# Patient Record
Sex: Female | Born: 1990 | Race: Black or African American | Hispanic: No | Marital: Single | Smoking: Current some day smoker
Health system: Southern US, Community
[De-identification: ages and names within clinical notes are randomized; demographics above are authoritative.]

## PROBLEM LIST (undated history)

## (undated) DIAGNOSIS — N871 Moderate cervical dysplasia: Secondary | ICD-10-CM

## (undated) DIAGNOSIS — A599 Trichomoniasis, unspecified: Secondary | ICD-10-CM

## (undated) DIAGNOSIS — B9689 Other specified bacterial agents as the cause of diseases classified elsewhere: Secondary | ICD-10-CM

## (undated) DIAGNOSIS — A749 Chlamydial infection, unspecified: Secondary | ICD-10-CM

## (undated) DIAGNOSIS — N760 Acute vaginitis: Secondary | ICD-10-CM

## (undated) DIAGNOSIS — A549 Gonococcal infection, unspecified: Secondary | ICD-10-CM

## (undated) HISTORY — DX: Moderate cervical dysplasia: N87.1

## (undated) HISTORY — PX: WISDOM TOOTH EXTRACTION: SHX21

---
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
## 2012-11-24 ENCOUNTER — Emergency Department (INDEPENDENT_AMBULATORY_CARE_PROVIDER_SITE_OTHER)
Admission: EM | Admit: 2012-11-24 | Discharge: 2012-11-24 | Disposition: A | Discharge status: Home / Self Care | Payer: Medicaid Other | Source: Home / Self Care

## 2012-11-24 ENCOUNTER — Encounter (HOSPITAL_COMMUNITY): Payer: Self-pay | Admitting: Emergency Medicine

## 2012-11-24 DIAGNOSIS — J029 Acute pharyngitis, unspecified: Secondary | ICD-10-CM

## 2012-11-24 DIAGNOSIS — R590 Localized enlarged lymph nodes: Secondary | ICD-10-CM

## 2012-11-24 MED ORDER — AMOXICILLIN 500 MG PO CAPS: 500.0000 mg | ORAL_CAPSULE | Freq: Three times a day (TID) | ORAL | Status: DC

## 2012-11-24 NOTE — ED Provider Notes (Signed)
History     CSN: [id number]  Arrival date & time 11/24/12  1847   None     Chief Complaint  Patient presents with  . URI    (Consider location/radiation/quality/duration/timing/severity/associated sxs/prior treatment) HPI Comments: 22 year old female that awoke this morning with sore throat, bodyaches, headache, malaise and decreased appetite.    History reviewed. No pertinent past medical history.  History reviewed. No pertinent past surgical history.  No family history on file.  History  Substance Use Topics  . Smoking status: Never Smoker   . Smokeless tobacco: Not on file  . Alcohol Use: No    OB History   Grav Para Term Preterm Abortions TAB SAB Ect Mult Living                  Review of Systems  Constitutional: Negative for fever, chills, activity change, appetite change and fatigue.  HENT: Positive for sore throat. Negative for congestion, facial swelling, rhinorrhea, neck pain, neck stiffness and postnasal drip.   Eyes: Negative.   Respiratory: Negative.   Cardiovascular: Negative.   Gastrointestinal: Negative.   Musculoskeletal: Positive for myalgias.  Skin: Negative.  Negative for pallor and rash.  Neurological: Negative.   Psychiatric/Behavioral: Negative.     Allergies  Review of patient's allergies indicates no known allergies.  Home Medications   Current Outpatient Rx  Name  Route  Sig  Dispense  Refill  . amoxicillin (AMOXIL) 500 MG capsule   Oral   Take 1 capsule (500 mg total) by mouth 3 (three) times daily.   21 capsule   0     BP 116/64  Pulse 129  Temp(Src) 99.7 F (37.6 C) (Oral)  Resp 18  SpO2 100%  LMP 11/10/2012  Physical Exam  Nursing note and vitals reviewed. Constitutional: She is oriented to person, place, and time. She appears well-developed and well-nourished. No distress.  HENT:  Right Ear: External ear normal.  Left Ear: External ear normal.  Oro pharynx with mildly enlarged  cryptic tonsils with white and  gray exudates. Posterior pharynx with erythema.   Eyes: Conjunctivae are normal.  Neck: Normal range of motion. Neck supple.  Cardiovascular: Normal rate, regular rhythm and normal heart sounds.   Pulmonary/Chest: Effort normal and breath sounds normal. No respiratory distress.  Musculoskeletal: Normal range of motion. She exhibits no edema.  Lymphadenopathy:    She has cervical adenopathy.  Neurological: She is alert and oriented to person, place, and time.  Skin: Skin is warm and dry. No rash noted.  Psychiatric: She has a normal mood and affect.    ED Course  Procedures (including critical care time)  Labs Reviewed  POCT RAPID STREP A ([facility] ONLY)   No results found. will will call  Results for orders placed during the hospital encounter of 11/24/12  POCT RAPID STREP A ([facility] ONLY)      Result Value Range   Streptococcus, Group A Screen (Direct) NEGATIVE  NEGATIVE      1. Exudative pharyngitis   2. Anterior cervical lymphadenopathy       MDM  Amoxil 500 tid x 7 d Cepacol Loz Ibuprofen 600 q 8h prn Rest at home        Hayden Rasmussen, NP 11/24/12 1942

## 2012-11-24 NOTE — ED Notes (Signed)
Pt c/o cold sx since this am Sx include: sore throat, chills, headache, body aches, felt warm, dry cough Denies: v/n/d  She is alert and oriented w/no signs of acute distress.

## 2012-11-29 NOTE — ED Provider Notes (Signed)
Medical screening examination/treatment/procedure(s) were performed by resident physician or non-physician practitioner and as supervising physician I was immediately available for consultation/collaboration.   Carron Mcmurry DOUGLAS MD.   Cartha Rotert D Favour Aleshire, MD 11/29/12 1004 

## 2013-07-06 ENCOUNTER — Encounter (HOSPITAL_COMMUNITY): Payer: Self-pay | Admitting: Emergency Medicine

## 2013-07-06 ENCOUNTER — Other Ambulatory Visit (HOSPITAL_COMMUNITY)
Admission: RE | Admit: 2013-07-06 | Discharge: 2013-07-06 | Disposition: A | Discharge status: Home / Self Care | Payer: Medicaid Other | Source: Ambulatory Visit | Attending: Family Medicine | Admitting: Family Medicine

## 2013-07-06 ENCOUNTER — Emergency Department (INDEPENDENT_AMBULATORY_CARE_PROVIDER_SITE_OTHER)
Admission: EM | Admit: 2013-07-06 | Discharge: 2013-07-06 | Disposition: A | Discharge status: Home / Self Care | Payer: Medicaid Other | Source: Home / Self Care

## 2013-07-06 DIAGNOSIS — N760 Acute vaginitis: Secondary | ICD-10-CM | POA: Insufficient documentation

## 2013-07-06 DIAGNOSIS — Z113 Encounter for screening for infections with a predominantly sexual mode of transmission: Secondary | ICD-10-CM | POA: Insufficient documentation

## 2013-07-06 DIAGNOSIS — R1032 Left lower quadrant pain: Secondary | ICD-10-CM

## 2013-07-06 LAB — POCT URINALYSIS DIP (DEVICE)
Glucose, UA: NEGATIVE mg/dL
Nitrite: NEGATIVE
Protein, ur: NEGATIVE mg/dL
Specific Gravity, Urine: 1.025 (ref 1.005–1.030)
Urobilinogen, UA: 2 mg/dL — ABNORMAL HIGH (ref 0.0–1.0)
pH: 7 (ref 5.0–8.0)

## 2013-07-06 LAB — POCT PREGNANCY, URINE: Preg Test, Ur: NEGATIVE

## 2013-07-06 NOTE — ED Notes (Signed)
Pt is here for intermittent abd pain onset may after she had her nexplanon placed Denies: f/v/n/d, urinary sxs Takes ibup w/temp relief She is alert w/no signs of acute distress.

## 2013-07-06 NOTE — ED Provider Notes (Signed)
Kristin Kline is a 22 y.o. female who presents to Urgent Care today for abdominal cramping. Patient does note intermittent mild to moderate abdominal pain and cramping since her Nexplanon was inserted in May of 2014. She notes irregular bleeding as well. She denies any severe or current abdominal pain nausea vomiting diarrhea or vaginal discharge or dysuria. She notes her symptoms worsened over the past last week. Her last menstrual period was in October however she started bleeding today. She feels well otherwise   History reviewed. No pertinent past medical history. History  Substance Use Topics  . Smoking status: Never Smoker   . Smokeless tobacco: Not on file  . Alcohol Use: No   ROS as above Medications reviewed. No current facility-administered medications for this encounter.   No current outpatient prescriptions on file.    Exam:  BP 131/85  Pulse 68  Temp(Src) 98.7 F (37.1 C) (Oral)  Resp 18  SpO2 100%  LMP 06/11/2013 Gen: Well NAD HEENT: EOMI,  MMM Lungs: CTABL Nl WOB Heart: RRR no MRG Abd: NABS, NT, ND, no CV angle tenderness to percussion Exts: Non edematous BL  LE, warm and well perfused.  GYN: Normal external genitalia. Vaginal canal is normal appearing. Cervix with some blood. Otherwise normal Bimanual exam is unremarkable and nontender  Results for orders placed during the hospital encounter of 07/06/13 (from the past 24 hour(s))  POCT URINALYSIS DIP (DEVICE)     Status: Abnormal   Collection Time    07/06/13  4:17 PM      Result Value Range   Glucose, UA NEGATIVE  NEGATIVE mg/dL   Bilirubin Urine NEGATIVE  NEGATIVE   Ketones, ur NEGATIVE  NEGATIVE mg/dL   Specific Gravity, Urine 1.025  1.005 - 1.030   Hgb urine dipstick MODERATE (*) NEGATIVE   pH 7.0  5.0 - 8.0   Protein, ur NEGATIVE  NEGATIVE mg/dL   Urobilinogen, UA 2.0 (*) 0.0 - 1.0 mg/dL   Nitrite NEGATIVE  NEGATIVE   Leukocytes, UA NEGATIVE  NEGATIVE  POCT PREGNANCY, URINE     Status: None    Collection Time    07/06/13  4:22 PM      Result Value Range   Preg Test, Ur NEGATIVE  NEGATIVE   No results found.  Assessment and Plan: 22 y.o. female with abdominal cramping. Possible menstrual cramping. Unclear etiology at this point. Appears to be more benign in nature. Cytology for gonorrhea, Chlamydia, trichomonas, yeast and BV are pending. Refer to OB/GYN for further evaluation and management Discussed warning signs or symptoms. Please see discharge instructions. Patient expresses understanding.      Rodolph Bong, MD 07/06/13 [phone]

## 2013-07-10 ENCOUNTER — Telehealth (HOSPITAL_COMMUNITY): Payer: Self-pay | Admitting: *Deleted

## 2013-07-10 ENCOUNTER — Telehealth (HOSPITAL_COMMUNITY): Payer: Self-pay | Admitting: Family Medicine

## 2013-07-10 MED ORDER — METRONIDAZOLE 500 MG PO TABS: 500.0000 mg | ORAL_TABLET | Freq: Two times a day (BID) | ORAL | Status: DC

## 2013-07-10 NOTE — ED Notes (Signed)
Cytology showing BV. Flagyl called into [facility]. RN to call patient  Rodolph Bong, MD 07/10/13 [phone]

## 2013-07-10 NOTE — ED Notes (Signed)
GC/Chlamydia neg., Affirm: Candida and Trich neg., Gardnerella pos.  Dr. Denyse Amass reviewed labs and e-prescribed Flagyl to the [facility] on [address].  I called pt.  Pt. verified x 2 and given results.  Pt. told she needs Flagyl for bacterial vaginosis.  Pt. told where to pick up Rx.  Pt. instructed to no alcohol while taking this medication. Pt. voiced understanding. Vassie Moselle 07/10/2013

## 2014-06-01 ENCOUNTER — Encounter (HOSPITAL_COMMUNITY): Payer: Self-pay | Admitting: Emergency Medicine

## 2014-06-01 ENCOUNTER — Emergency Department (INDEPENDENT_AMBULATORY_CARE_PROVIDER_SITE_OTHER)
Admission: EM | Admit: 2014-06-01 | Discharge: 2014-06-01 | Disposition: A | Discharge status: Home / Self Care | Payer: Self-pay | Source: Home / Self Care

## 2014-06-01 DIAGNOSIS — N926 Irregular menstruation, unspecified: Secondary | ICD-10-CM

## 2014-06-01 DIAGNOSIS — N939 Abnormal uterine and vaginal bleeding, unspecified: Secondary | ICD-10-CM

## 2014-06-01 LAB — POCT I-STAT, CHEM 8
BUN: 7 mg/dL (ref 6–23)
Calcium, Ion: 1.28 mmol/L — ABNORMAL HIGH (ref 1.12–1.23)
Chloride: 106 mEq/L (ref 96–112)
Creatinine, Ser: 1 mg/dL (ref 0.50–1.10)
Glucose, Bld: 93 mg/dL (ref 70–99)
HEMATOCRIT: 44 % (ref 36.0–46.0)
HEMOGLOBIN: 15 g/dL (ref 12.0–15.0)
POTASSIUM: 4.5 meq/L (ref 3.7–5.3)
SODIUM: 140 meq/L (ref 137–147)
TCO2: 24 mmol/L (ref 0–100)

## 2014-06-01 LAB — POCT PREGNANCY, URINE: PREG TEST UR: NEGATIVE

## 2014-06-01 MED ORDER — NORETHINDRONE-ETH ESTRADIOL 1-35 MG-MCG PO TABS: ORAL_TABLET | ORAL | Status: DC

## 2014-06-01 NOTE — ED Notes (Signed)
Reportedly has been having vaginal bleeding off and on since August. Has an implanted in arm , and is also on depo for [facility] until she goes back in to have her implanon removed in a few weeks. States she was tested for STD's and had a female exam on her [facility] visit. NAD

## 2014-06-01 NOTE — ED Provider Notes (Signed)
CSN: [id number]     Arrival date & time 06/01/14  1507 History   First MD Initiated Contact with Patient 06/01/14 1541     Chief Complaint  Patient presents with  . Vaginal Bleeding   (Consider location/radiation/quality/duration/timing/severity/associated sxs/prior Treatment) HPI Comments: 23 year old female complaining of any with vaginal bleeding for one month. She is currently using the Implanon device for birth control. She states she also received a Depakote injection in July. The bleeding is on a daily basis and near constant. She uses 3-4 tampons per day. Occasionally has mild pelvic cramping.   History reviewed. No pertinent past medical history. History reviewed. No pertinent past surgical history. History reviewed. No pertinent family history. History  Substance Use Topics  . Smoking status: Never Smoker   . Smokeless tobacco: Not on file  . Alcohol Use: No   OB History   Grav Para Term Preterm Abortions TAB SAB Ect Mult Living                 Review of Systems  Constitutional: Negative for fever, activity change and fatigue.  HENT: Negative.   Respiratory: Negative for cough and shortness of breath.   Cardiovascular: Negative for chest pain.  Gastrointestinal: Negative.   Genitourinary: Positive for vaginal bleeding and menstrual problem. Negative for dysuria, urgency, frequency, flank pain, vaginal discharge and vaginal pain.  Skin: Negative for pallor and rash.    Allergies  Review of patient's allergies indicates no known allergies.  Home Medications   Prior to Admission medications   Medication Sig Start Date End Date Taking? Authorizing Provider  norethindrone-ethinyl estradiol 1/35 (ORTHO-NOVUM 1/35, 28,) tablet One tablet bid x 3 d, then 1 q d. 06/01/14   Hayden Rasmussen, NP   BP 104/67  Pulse 66  Temp(Src) 97.9 F (36.6 C) (Oral)  Resp 12  SpO2 100% Physical Exam  Nursing note and vitals reviewed. Constitutional: She is oriented to person, place, and  time. She appears well-developed and well-nourished.  Pulmonary/Chest: Effort normal. No respiratory distress.  Abdominal: She exhibits no distension and no mass. There is no tenderness. There is no rebound and no guarding.  Genitourinary: Vagina normal. No vaginal discharge found.  Cx posterior Ectocx with nabothian cysts Os open and bleeding originating from os. No lesions seen.  Musculoskeletal: She exhibits no edema.  Neurological: She is alert and oriented to person, place, and time.  Skin: Skin is warm and dry.  Psychiatric: She has a normal mood and affect.    ED Course  Procedures (including critical care time) Labs Review Labs Reviewed  POCT I-STAT, CHEM 8 - Abnormal; Notable for the following:    Calcium, Ion 1.28 (*)    All other components within normal limits  POCT PREGNANCY, URINE   Results for orders placed during the hospital encounter of 06/01/14  POCT PREGNANCY, URINE      Result Value Ref Range   Preg Test, Ur NEGATIVE  NEGATIVE  POCT I-STAT, CHEM 8      Result Value Ref Range   Sodium 140  137 - 147 mEq/L   Potassium 4.5  3.7 - 5.3 mEq/L   Chloride 106  96 - 112 mEq/L   BUN 7  6 - 23 mg/dL   Creatinine, Ser 7.82  0.50 - 1.10 mg/dL   Glucose, Bld 93  70 - 99 mg/dL   Calcium, Ion 9.56 (*) 1.12 - 1.23 mmol/L   TCO2 24  0 - 100 mmol/L   Hemoglobin 15.0  12.0 -  15.0 g/dL   HCT 08.6  57.8 - 46.9 %    Imaging Review No results found.   MDM   1. Abnormal uterine bleeding unrelated to menstrual cycle     AUB secondary to progesterone excess, Implanon and Depo injection Tx with Ortho Novum 1/35 bid x 3 d then 1 q d. See PCP/GYN ASAP    Hayden Rasmussen, NP 06/01/14 1655

## 2014-06-01 NOTE — Discharge Instructions (Signed)
Abnormal Uterine Bleeding Abnormal uterine bleeding can affect women at various stages in life, including teenagers, women in their reproductive years, pregnant women, and women who have reached menopause. Several kinds of uterine bleeding are considered abnormal, including:  Bleeding or spotting between periods.   Bleeding after sexual intercourse.   Bleeding that is heavier or more than normal.   Periods that last longer than usual.  Bleeding after menopause.  Many cases of abnormal uterine bleeding are minor and simple to treat, while others are more serious. Any type of abnormal bleeding should be evaluated by your health care provider. Treatment will depend on the cause of the bleeding. HOME CARE INSTRUCTIONS Monitor your condition for any changes. The following actions may help to alleviate any discomfort you are experiencing:  Avoid the use of tampons and douches as directed by your health care provider.  Change your pads frequently. You should get regular pelvic exams and Pap tests. Keep all follow-up appointments for diagnostic tests as directed by your health care provider.  SEEK MEDICAL CARE IF:   Your bleeding lasts more than 1 week.   You feel dizzy at times.  SEEK IMMEDIATE MEDICAL CARE IF:   You pass out.   You are changing pads every 15 to 30 minutes.   You have abdominal pain.  You have a fever.   You become sweaty or weak.   You are passing large blood clots from the vagina.   You start to feel nauseous and vomit. MAKE SURE YOU:   Understand these instructions.  Will watch your condition.  Will get help right away if you are not doing well or get worse. Document Released: 08/24/2005 Document Revised: 08/29/2013 Document Reviewed: 03/23/2013 [employer] Patient Information 2015 [employer]. This information is not intended to replace advice given to you by your health care provider. Make sure you discuss any questions you have with your  health care provider.  

## 2014-06-02 NOTE — ED Provider Notes (Signed)
Medical screening examination/treatment/procedure(s) were performed by resident physician or non-physician practitioner and as supervising physician I was immediately available for consultation/collaboration.   Cullen Lahaie DOUGLAS MD.   Izaiyah Kleinman D Bryndle Corredor, MD 06/02/14 0952 

## 2014-09-11 ENCOUNTER — Emergency Department (HOSPITAL_COMMUNITY)
Admission: EM | Admit: 2014-09-11 | Discharge: 2014-09-11 | Disposition: A | Discharge status: Home / Self Care | Payer: Medicaid Other | Source: Home / Self Care | Attending: Family Medicine | Admitting: Family Medicine

## 2014-09-11 ENCOUNTER — Encounter (HOSPITAL_COMMUNITY): Payer: Self-pay | Admitting: *Deleted

## 2014-09-11 DIAGNOSIS — N938 Other specified abnormal uterine and vaginal bleeding: Secondary | ICD-10-CM

## 2014-09-11 LAB — POCT PREGNANCY, URINE: PREG TEST UR: NEGATIVE

## 2014-09-11 MED ORDER — NORETHINDRONE-ETH ESTRADIOL 1-35 MG-MCG PO TABS: ORAL_TABLET | ORAL | Status: DC

## 2014-09-11 NOTE — ED Provider Notes (Signed)
CSN: [id number]     Arrival date & time 09/11/14  1156 History   First MD Initiated Contact with Patient 09/11/14 1213     Chief Complaint  Patient presents with  . Vaginal Bleeding   (Consider location/radiation/quality/duration/timing/severity/associated sxs/prior Treatment) Patient is a 24 y.o. female presenting with vaginal bleeding. The history is provided by the patient.  Vaginal Bleeding Quality:  Clots and spotting Severity:  Mild Onset quality:  Gradual Duration:  4 months Progression:  Waxing and waning Chronicity:  Recurrent Menstrual history:  Irregular Number of tampons used:  4 Context comment:  Seen 06/01/14, problem continues, never had gyn f/u. Risk factors comment:  Has never been reg with birth control.   History reviewed. No pertinent past medical history. History reviewed. No pertinent past surgical history. History reviewed. No pertinent family history. History  Substance Use Topics  . Smoking status: Never Smoker   . Smokeless tobacco: Not on file  . Alcohol Use: No   OB History    No data available     Review of Systems  Constitutional: Negative.   Gastrointestinal: Negative.   Genitourinary: Positive for vaginal bleeding and menstrual problem.    Allergies  Review of patient's allergies indicates no known allergies.  Home Medications   Prior to Admission medications   Medication Sig Start Date End Date Taking? Authorizing Provider  norethindrone-ethinyl estradiol 1/35 (ORTHO-NOVUM, NORTREL,CYCLAFEM) tablet 1 pill bid for 1 week then 1 daily for 1 wk then start new pk at 1 tab daily. 09/11/14   Linna HoffJames D Emoni Whitworth, MD   BP 113/77 mmHg  Pulse 73  Temp(Src) 98.4 F (36.9 C) (Oral)  Resp 12  SpO2 100%  LMP  Physical Exam  Constitutional: She is oriented to person, place, and time. She appears well-developed and well-nourished.  Abdominal: Soft. Bowel sounds are normal.  Genitourinary: No vaginal discharge found.  Neurological: She is alert and  oriented to person, place, and time.  Skin: Skin is warm and dry.  Nursing note and vitals reviewed.   ED Course  Procedures (including critical care time) Labs Review Labs Reviewed  POCT PREGNANCY, URINE    Imaging Review No results found.   MDM   1. DUB (dysfunctional uterine bleeding)        Linna HoffJames D Aleya Durnell, MD 09/11/14 1244

## 2014-09-11 NOTE — ED Notes (Addendum)
Pt reports  Symptoms  Of  Vaginal bleeding   And low  abd  Pain    For  Over  1  Month        Pt  Takes    Depo  injection         Was  On implant  And  Had  It  Removed

## 2014-09-11 NOTE — Discharge Instructions (Signed)
Take pills as prescribed and call clinic for follow-up in 1 week.

## 2014-11-18 ENCOUNTER — Encounter (HOSPITAL_COMMUNITY): Payer: Self-pay

## 2014-11-18 ENCOUNTER — Emergency Department (HOSPITAL_COMMUNITY)
Admission: EM | Admit: 2014-11-18 | Discharge: 2014-11-18 | Disposition: A | Discharge status: Home / Self Care | Payer: Medicaid Other | Attending: Emergency Medicine | Admitting: Emergency Medicine

## 2014-11-18 DIAGNOSIS — H9209 Otalgia, unspecified ear: Secondary | ICD-10-CM | POA: Insufficient documentation

## 2014-11-18 DIAGNOSIS — Z793 Long term (current) use of hormonal contraceptives: Secondary | ICD-10-CM | POA: Insufficient documentation

## 2014-11-18 DIAGNOSIS — R51 Headache: Secondary | ICD-10-CM | POA: Diagnosis not present

## 2014-11-18 DIAGNOSIS — R630 Anorexia: Secondary | ICD-10-CM | POA: Diagnosis not present

## 2014-11-18 DIAGNOSIS — J029 Acute pharyngitis, unspecified: Secondary | ICD-10-CM | POA: Diagnosis present

## 2014-11-18 DIAGNOSIS — J36 Peritonsillar abscess: Secondary | ICD-10-CM | POA: Insufficient documentation

## 2014-11-18 DIAGNOSIS — Z79899 Other long term (current) drug therapy: Secondary | ICD-10-CM | POA: Diagnosis not present

## 2014-11-18 LAB — RAPID STREP SCREEN (MED CTR MEBANE ONLY): STREPTOCOCCUS, GROUP A SCREEN (DIRECT): NEGATIVE

## 2014-11-18 MED ORDER — DEXAMETHASONE SODIUM PHOSPHATE 10 MG/ML IJ SOLN
10.0000 mg | Freq: Once | INTRAMUSCULAR | Status: AC
  Administered 2014-11-18: 10 mg via INTRAMUSCULAR
  Filled 2014-11-18: qty 1

## 2014-11-18 MED ORDER — HYDROCODONE-ACETAMINOPHEN 7.5-325 MG/15ML PO SOLN: 15.0000 mL | Freq: Three times a day (TID) | ORAL | Status: DC | PRN

## 2014-11-18 MED ORDER — NAPROXEN 500 MG PO TABS: 500.0000 mg | ORAL_TABLET | Freq: Two times a day (BID) | ORAL | Status: DC

## 2014-11-18 MED ORDER — CLINDAMYCIN HCL 300 MG PO CAPS
300.0000 mg | ORAL_CAPSULE | Freq: Once | ORAL | Status: AC
  Administered 2014-11-18: 300 mg via ORAL
  Filled 2014-11-18: qty 1

## 2014-11-18 MED ORDER — CLINDAMYCIN HCL 150 MG PO CAPS: 300.0000 mg | ORAL_CAPSULE | Freq: Three times a day (TID) | ORAL | Status: DC

## 2014-11-18 MED ORDER — HYDROCODONE-ACETAMINOPHEN 7.5-325 MG/15ML PO SOLN
10.0000 mL | Freq: Once | ORAL | Status: AC
  Administered 2014-11-18: 10 mL via ORAL
  Filled 2014-11-18: qty 15

## 2014-11-18 NOTE — ED Notes (Signed)
Patient reports she has had a sore throat since Monday.

## 2014-11-18 NOTE — Discharge Instructions (Signed)
Follow-up with an ear nose or throat specialist as soon as possible. Take clindamycin as prescribed. Take naproxen for pain. You may take Hycet as prescribed for pain as well, as needed. Return to the emergency department if symptoms worsen.  Tonsillitis Tonsillitis is an infection of the throat that causes the tonsils to become red, tender, and swollen. Tonsils are collections of lymphoid tissue at the back of the throat. Each tonsil has crevices (crypts). Tonsils help fight nose and throat infections and keep infection from spreading to other parts of the body for the first 18 months of life.  CAUSES Sudden (acute) tonsillitis is usually caused by infection with streptococcal bacteria. Long-lasting (chronic) tonsillitis occurs when the crypts of the tonsils become filled with pieces of food and bacteria, which makes it easy for the tonsils to become repeatedly infected. SYMPTOMS  Symptoms of tonsillitis include:  A sore throat, with possible difficulty swallowing.  White patches on the tonsils.  Fever.  Tiredness.  New episodes of snoring during sleep, when you did not snore before.  Small, foul-smelling, yellowish-white pieces of material (tonsilloliths) that you occasionally cough up or spit out. The tonsilloliths can also cause you to have bad breath. DIAGNOSIS Tonsillitis can be diagnosed through a physical exam. Diagnosis can be confirmed with the results of lab tests, including a throat culture. TREATMENT  The goals of tonsillitis treatment include the reduction of the severity and duration of symptoms and prevention of associated conditions. Symptoms of tonsillitis can be improved with the use of steroids to reduce the swelling. Tonsillitis caused by bacteria can be treated with antibiotic medicines. Usually, treatment with antibiotic medicines is started before the cause of the tonsillitis is known. However, if it is determined that the cause is not bacterial, antibiotic medicines  will not treat the tonsillitis. If attacks of tonsillitis are severe and frequent, your health care provider may recommend surgery to remove the tonsils (tonsillectomy). HOME CARE INSTRUCTIONS   Rest as much as possible and get plenty of sleep.  Drink plenty of fluids. While the throat is very sore, eat soft foods or liquids, such as sherbet, soups, or instant breakfast drinks.  Eat frozen ice pops.  Gargle with a warm or cold liquid to help soothe the throat. Mix 1/4 teaspoon of salt and 1/4 teaspoon of baking soda in 8 oz of water. SEEK MEDICAL CARE IF:   Large, tender lumps develop in your neck.  A rash develops.  A green, yellow-brown, or bloody substance is coughed up.  You are unable to swallow liquids or food for 24 hours.  You notice that only one of the tonsils is swollen. SEEK IMMEDIATE MEDICAL CARE IF:   You develop any new symptoms such as vomiting, severe headache, stiff neck, chest pain, or trouble breathing or swallowing.  You have severe throat pain along with drooling or voice changes.  You have severe pain, unrelieved with recommended medications.  You are unable to fully open the mouth.  You develop redness, swelling, or severe pain anywhere in the neck.  You have a fever. MAKE SURE YOU:   Understand these instructions.  Will watch your condition.  Will get help right away if you are not doing well or get worse. Document Released: 06/03/2005 Document Revised: 01/08/2014 Document Reviewed: 02/10/2013 [facility] LP[facility] Patient Information 2015 [employer], [place]. This information is not intended to replace advice given to you by your health care provider. Make sure you discuss any questions you have with your health care provider.

## 2014-11-18 NOTE — ED Provider Notes (Signed)
CSN: [id number]     Arrival date & time 11/18/14  2139 History  This chart was scribed for Kristin MaduraKelly Guida Asman, PA-C working with Arby BarretteMarcy Pfeiffer, MD by Kristin Kline, ED Scribe. This patient was seen in room WTR5/WTR5 and the patient's care was started at 10:56 PM.  Chief Complaint  Patient presents with  . Sore Throat   HPI HPI Comments: Kristin Kline is a 24 y.o. female who presents to the Emergency Department complaining of sore throat onset 6 days prior. Pt states the pain is radiating to her right ear. Pt states she has associated HA and subjective fever and chills. Pt states that it is painful to swallow. Pt states she has tried hot tea with no relief. Pt states she has tried tylenol with no relief as well. Pt denies any recent sick contacts. Denies congestion, rhinorrhea, ear discharge or trouble swallowing.  History reviewed. No pertinent past medical history. History reviewed. No pertinent past surgical history. No family history on file. History  Substance Use Topics  . Smoking status: Never Smoker   . Smokeless tobacco: Not on file  . Alcohol Use: No   OB History    No data available      Review of Systems  Constitutional: Positive for fever (subjective) and appetite change.  HENT: Positive for ear pain and sore throat. Negative for congestion, ear discharge, rhinorrhea and trouble swallowing.   Neurological: Positive for headaches.  All other systems reviewed and are negative.   Allergies  Review of patient's allergies indicates no known allergies.  Home Medications   Prior to Admission medications   Medication Sig Start Date End Date Taking? Authorizing Provider  Homeopathic Products (EAR PAIN RELIEF HOMEOPATHIC) SOLN Place 1 drop in ear(s) 2 (two) times daily as needed (pain).   Yes Historical Provider, MD  ibuprofen (ADVIL,MOTRIN) 200 MG tablet Take 200 mg by mouth every 6 (six) hours as needed for moderate pain.   Yes Historical Provider, MD  clindamycin  (CLEOCIN) 150 MG capsule Take 2 capsules (300 mg total) by mouth 3 (three) times daily. May dispense as 150mg  capsules 11/18/14   Kristin MaduraKelly Advaith Lamarque, PA-C  HYDROcodone-acetaminophen (HYCET) 7.5-325 mg/15 ml solution Take 15 mLs by mouth every 8 (eight) hours as needed for moderate pain (Do not drive after taking). 11/18/14   Kristin MaduraKelly Jacklynn Dehaas, PA-C  naproxen (NAPROSYN) 500 MG tablet Take 1 tablet (500 mg total) by mouth 2 (two) times daily. 11/18/14   Kristin MaduraKelly Tanish Prien, PA-C  norethindrone-ethinyl estradiol 1/35 (ORTHO-NOVUM, NORTREL,CYCLAFEM) tablet 1 pill bid for 1 week then 1 daily for 1 wk then start new pk at 1 tab daily. Patient not taking: Reported on 11/18/2014 09/11/14   Linna HoffJames D Kindl, MD   BP 126/85 mmHg  Pulse 89  Temp(Src) 98.1 F (36.7 C) (Oral)  Ht 5\' 6"  (1.676 m)  Wt 120 lb (54.432 kg)  BMI 19.38 kg/m2  SpO2 100%   Physical Exam  Constitutional: She is oriented to person, place, and time. She appears well-developed and well-nourished. No distress.  Nontoxic/nonseptic appearing  HENT:  Head: Normocephalic and atraumatic.  Mouth/Throat: Uvula is midline and mucous membranes are normal. No trismus in the jaw. No uvula swelling. Oropharyngeal exudate (mild) and posterior oropharyngeal erythema present.  Posterior oropharyngeal erythema with tonsillar enlargement; R>L. Uvula midline. Mild exudates. Patient tolerating secretions without difficulty or drooling. No tripoding.  Eyes: Conjunctivae and EOM are normal. No scleral icterus.  Neck: Normal range of motion.  Neck supple. No stridor.  Cardiovascular: Normal  rate, regular rhythm and intact distal pulses.   Pulmonary/Chest: Effort normal. No respiratory distress.  Respirations even and unlabored  Musculoskeletal: Normal range of motion.  Lymphadenopathy:    She has cervical adenopathy (mild R anterior cervical).  Neurological: She is alert and oriented to person, place, and time. She exhibits normal muscle tone. Coordination normal.  Skin: Skin is  warm and dry. No rash noted. She is not diaphoretic. No erythema. No pallor.  Psychiatric: She has a normal mood and affect. Her behavior is normal.  Nursing note and vitals reviewed.   ED Course  Procedures (including critical care time) DIAGNOSTIC STUDIES: Oxygen Saturation is 100% on RA, normal by my interpretation.    COORDINATION OF CARE: 11:07 PM-Discussed treatment plan with pt at bedside and pt agreed to plan.   Labs Review Labs Reviewed  RAPID STREP SCREEN  CULTURE, GROUP A STREP    Imaging Review No results found.   EKG Interpretation None      MDM   Final diagnoses:  Pharyngitis  Peritonsillar abscess    24 year old female presents to the emergency department for further evaluation of dysphasia. She has mild cervical lymphadenopathy. She is tolerating her secretions without difficulty or drooling. Uvula is midline. No evidence of infection spread to soft tissue. She has bilateral tonsillar enlargement, but right tonsil appears larger in size than the left. Concern for early tonsillar abscess; rapid strep is negative. Will tx with Clindamycin and refer to ENT for f/u. Patient given Decadron in ED. Have advised Naproxen and Hycet as outpatient. Return precautions provided and patient agreeable to plan with no unaddressed concerns. Patient discharged in good condition.  I personally performed the services described in this documentation, which was scribed in my presence. The recorded information has been reviewed and is accurate.   Filed Vitals:   11/18/14 2155  BP: 126/85  Pulse: 89  Temp: 98.1 F (36.7 C)  TempSrc: Oral  Height:  (1.676 m)  Weight: 120 lb (54.432 kg)  SpO2: 100%       Kristin Madura, PA-C 11/18/14 2326  Arby Barrette, MD 11/23/14 1013

## 2014-11-21 LAB — CULTURE, GROUP A STREP: Strep A Culture: NEGATIVE

## 2014-11-22 ENCOUNTER — Ambulatory Visit (INDEPENDENT_AMBULATORY_CARE_PROVIDER_SITE_OTHER): Payer: Medicaid Other | Admitting: Otolaryngology

## 2014-12-06 ENCOUNTER — Ambulatory Visit (INDEPENDENT_AMBULATORY_CARE_PROVIDER_SITE_OTHER): Payer: Medicaid Other | Admitting: Otolaryngology

## 2016-03-12 ENCOUNTER — Ambulatory Visit (INDEPENDENT_AMBULATORY_CARE_PROVIDER_SITE_OTHER): Payer: Medicaid Other | Admitting: Otolaryngology

## 2016-03-12 DIAGNOSIS — J351 Hypertrophy of tonsils: Secondary | ICD-10-CM | POA: Diagnosis not present

## 2016-05-04 ENCOUNTER — Inpatient Hospital Stay (HOSPITAL_COMMUNITY)
Admission: AD | Admit: 2016-05-04 | Discharge: 2016-05-04 | Disposition: A | Discharge status: Home / Self Care | Payer: Medicaid Other | Source: Ambulatory Visit | Attending: Family Medicine | Admitting: Family Medicine

## 2016-05-04 ENCOUNTER — Encounter (HOSPITAL_COMMUNITY): Payer: Self-pay | Admitting: Advanced Practice Midwife

## 2016-05-04 DIAGNOSIS — Z7251 High risk heterosexual behavior: Secondary | ICD-10-CM

## 2016-05-04 DIAGNOSIS — F17210 Nicotine dependence, cigarettes, uncomplicated: Secondary | ICD-10-CM | POA: Diagnosis not present

## 2016-05-04 DIAGNOSIS — R102 Pelvic and perineal pain: Secondary | ICD-10-CM | POA: Diagnosis present

## 2016-05-04 DIAGNOSIS — B9689 Other specified bacterial agents as the cause of diseases classified elsewhere: Secondary | ICD-10-CM

## 2016-05-04 DIAGNOSIS — A499 Bacterial infection, unspecified: Secondary | ICD-10-CM | POA: Diagnosis not present

## 2016-05-04 DIAGNOSIS — N898 Other specified noninflammatory disorders of vagina: Secondary | ICD-10-CM | POA: Diagnosis present

## 2016-05-04 DIAGNOSIS — N946 Dysmenorrhea, unspecified: Secondary | ICD-10-CM | POA: Insufficient documentation

## 2016-05-04 DIAGNOSIS — N760 Acute vaginitis: Secondary | ICD-10-CM | POA: Diagnosis not present

## 2016-05-04 HISTORY — DX: Chlamydial infection, unspecified: A74.9

## 2016-05-04 HISTORY — DX: Gonococcal infection, unspecified: A54.9

## 2016-05-04 LAB — URINALYSIS, ROUTINE W REFLEX MICROSCOPIC
BILIRUBIN URINE: NEGATIVE
Glucose, UA: NEGATIVE mg/dL
Hgb urine dipstick: NEGATIVE
Ketones, ur: NEGATIVE mg/dL
Leukocytes, UA: NEGATIVE
Nitrite: NEGATIVE
Protein, ur: NEGATIVE mg/dL
Specific Gravity, Urine: 1.025 (ref 1.005–1.030)
pH: 6 (ref 5.0–8.0)

## 2016-05-04 LAB — CBC WITH DIFFERENTIAL/PLATELET
BASOS PCT: 0 %
Basophils Absolute: 0 10*3/uL (ref 0.0–0.1)
Eosinophils Absolute: 0 10*3/uL (ref 0.0–0.7)
Eosinophils Relative: 1 %
HEMATOCRIT: 39.4 % (ref 36.0–46.0)
Hemoglobin: 14 g/dL (ref 12.0–15.0)
LYMPHS PCT: 43 %
Lymphs Abs: 2.4 10*3/uL (ref 0.7–4.0)
MCH: 32.3 pg (ref 26.0–34.0)
MCHC: 35.5 g/dL (ref 30.0–36.0)
MCV: 91 fL (ref 78.0–100.0)
MONOS PCT: 7 %
Monocytes Absolute: 0.4 10*3/uL (ref 0.1–1.0)
NEUTROS ABS: 2.7 10*3/uL (ref 1.7–7.7)
Neutrophils Relative %: 49 %
Platelets: 236 10*3/uL (ref 150–400)
RBC: 4.33 MIL/uL (ref 3.87–5.11)
RDW: 12.1 % (ref 11.5–15.5)
WBC: 5.5 10*3/uL (ref 4.0–10.5)

## 2016-05-04 LAB — WET PREP, GENITAL
Sperm: NONE SEEN
Trich, Wet Prep: NONE SEEN
Yeast Wet Prep HPF POC: NONE SEEN

## 2016-05-04 LAB — POCT PREGNANCY, URINE: Preg Test, Ur: NEGATIVE

## 2016-05-04 MED ORDER — METRONIDAZOLE 500 MG PO TABS: 500.0000 mg | ORAL_TABLET | Freq: Two times a day (BID) | ORAL | 0 refills | Status: DC

## 2016-05-04 NOTE — MAU Note (Signed)
Pt started her period the beginning of August, lasted 4 days.  Has had cramping ever since.  Pt was on birth control pills, stopped the end of last year.  Has clear/white discharge, has odor.  Denies dysuria.

## 2016-05-04 NOTE — Discharge Instructions (Signed)
Bacterial Vaginosis °Bacterial vaginosis is a vaginal infection that occurs when the normal balance of bacteria in the vagina is disrupted. It results from an overgrowth of certain bacteria. This is the most common vaginal infection in women of childbearing age. Treatment is important to prevent complications, especially in pregnant women, as it can cause a premature delivery. °CAUSES  °Bacterial vaginosis is caused by an increase in harmful bacteria that are normally present in smaller amounts in the vagina. Several different kinds of bacteria can cause bacterial vaginosis. However, the reason that the condition develops is not fully understood. °RISK FACTORS °Certain activities or behaviors can put you at an increased risk of developing bacterial vaginosis, including: °· Having a new sex partner or multiple sex partners. °· Douching. °· Using an intrauterine device (IUD) for contraception. °Women do not get bacterial vaginosis from toilet seats, bedding, swimming pools, or contact with objects around them. °SIGNS AND SYMPTOMS  °Some women with bacterial vaginosis have no signs or symptoms. Common symptoms include: °· Grey vaginal discharge. °· A fishlike odor with discharge, especially after sexual intercourse. °· Itching or burning of the vagina and vulva. °· Burning or pain with urination. °DIAGNOSIS  °Your health care provider will take a medical history and examine the vagina for signs of bacterial vaginosis. A sample of vaginal fluid may be taken. Your health care provider will look at this sample under a microscope to check for bacteria and abnormal cells. A vaginal pH test may also be done.  °TREATMENT  °Bacterial vaginosis may be treated with antibiotic medicines. These may be given in the form of a pill or a vaginal cream. A second round of antibiotics may be prescribed if the condition comes back after treatment. Because bacterial vaginosis increases your risk for sexually transmitted diseases, getting  treated can help reduce your risk for chlamydia, gonorrhea, HIV, and herpes. °HOME CARE INSTRUCTIONS  °· Only take over-the-counter or prescription medicines as directed by your health care provider. °· If antibiotic medicine was prescribed, take it as directed. Make sure you finish it even if you start to feel better. °· Tell all sexual partners that you have a vaginal infection. They should see their health care provider and be treated if they have problems, such as a mild rash or itching. °· During treatment, it is important that you follow these instructions: °¨ Avoid sexual activity or use condoms correctly. °¨ Do not douche. °¨ Avoid alcohol as directed by your health care provider. °¨ Avoid breastfeeding as directed by your health care provider. °SEEK MEDICAL CARE IF:  °· Your symptoms are not improving after 3 days of treatment. °· You have increased discharge or pain. °· You have a fever. °MAKE SURE YOU:  °· Understand these instructions. °· Will watch your condition. °· Will get help right away if you are not doing well or get worse. °FOR MORE INFORMATION  °Centers for Disease Control and Prevention, Division of STD Prevention: www.cdc.gov/std °American Sexual Health Association (ASHA): www.ashastd.org  °  °This information is not intended to replace advice given to you by your health care provider. Make sure you discuss any questions you have with your health care provider. °  °Document Released: 08/24/2005 Document Revised: 09/14/2014 Document Reviewed: 04/05/2013 °Elsevier Interactive Patient Education ©2016 Elsevier Inc. ° ° ° °Safe Sex °Safe sex is about reducing the risk of giving or getting a sexually transmitted disease (STD). STDs are spread through sexual contact involving the genitals, mouth, or rectum. Some STDs can be   cured and others cannot. Safe sex can also prevent unintended pregnancies.  °WHAT ARE SOME SAFE SEX PRACTICES? °· Limit your sexual activity to only one partner who is having sex  with only you. °· Talk to your partner about his or her past partners, past STDs, and drug use. °· Use a condom every time you have sexual intercourse. This includes vaginal, oral, and anal sexual activity. Both females and males should wear condoms during oral sex. Only use latex or polyurethane condoms and water-based lubricants. Using petroleum-based lubricants or oils to lubricate a condom will weaken the condom and increase the chance that it will break. The condom should be in place from the beginning to the end of sexual activity. Wearing a condom reduces, but does not completely eliminate, your risk of getting or giving an STD. STDs can be spread by contact with infected body fluids and skin. °· Get vaccinated for hepatitis B and HPV. °· Avoid alcohol and recreational drugs, which can affect your judgment. You may forget to use a condom or participate in high-risk sex. °· For females, avoid douching after sexual intercourse. Douching can spread an infection farther into the reproductive tract. °· Check your body for signs of sores, blisters, rashes, or unusual discharge. See your health care provider if you notice any of these signs. °· Avoid sexual contact if you have symptoms of an infection or are being treated for an STD. If you or your partner has herpes, avoid sexual contact when blisters are present. Use condoms at all other times. °· If you are at risk of being infected with HIV, it is recommended that you take a prescription medicine daily to prevent HIV infection. This is called pre-exposure prophylaxis (PrEP). You are considered at risk if: °¨ You are a man who has sex with other men (MSM). °¨ You are a heterosexual man or woman who is sexually active with more than one partner. °¨ You take drugs by injection. °¨ You are sexually active with a partner who has HIV. °· Talk with your health care provider about whether you are at high risk of being infected with HIV. If you choose to begin PrEP, you  should first be tested for HIV. You should then be tested every 3 months for as long as you are taking PrEP. °· See your health care provider for regular screenings, exams, and tests for other STDs. Before having sex with a new partner, each of you should be screened for STDs and should talk about the results with each other. °WHAT ARE THE BENEFITS OF SAFE SEX?  °· There is less chance of getting or giving an STD. °· You can prevent unwanted or unintended pregnancies. °· By discussing safe sex concerns with your partner, you may increase feelings of intimacy, comfort, trust, and honesty between the two of you. °  °This information is not intended to replace advice given to you by your health care provider. Make sure you discuss any questions you have with your health care provider. °  °Document Released: 10/01/2004 Document Revised: 09/14/2014 Document Reviewed: 02/15/2012 °Elsevier Interactive Patient Education ©2016 Elsevier Inc. ° °

## 2016-05-04 NOTE — MAU Provider Note (Signed)
Chief Complaint:  Abdominal Pain   First Provider Initiated Contact with Patient 05/04/16 [id number]      HPI: Kristin Kline is a 25 y.o. G1P1000 who presents to maternity admissions reporting pelvic cramping for 3-4 weeks, since period a month ago. Comes and goes.  Has not tried anything.  Had Trich a month ago, but did have sex with him after treatment and has no idea whether or not he was treated. Has since broken up with him..   States is trying to get pregnant, but not with him.  Does not have a new partner yet. States was told she could not get pregnant due to past history of GC and chlamydia.  Does not use condoms.. She reports no vaginal bleeding, vaginal itching/burning, urinary symptoms, h/a, dizziness, n/v, or fever/chills.    Abdominal Cramping  This is a new problem. The current episode started 1 to 4 weeks ago. The onset quality is gradual. The problem occurs intermittently. The problem has been waxing and waning. The pain is located in the suprapubic region, LLQ and RLQ. The pain is mild. The quality of the pain is aching and cramping. The abdominal pain does not radiate. Pertinent negatives include no constipation, diarrhea, dysuria, fever, hematuria, myalgias, nausea or vomiting. Nothing aggravates the pain. The pain is relieved by nothing. She has tried acetaminophen for the symptoms. The treatment provided mild relief.   RN Note: Pt started her period the beginning of August, lasted 4 days.  Has had cramping ever since.  Pt was on birth control pills, stopped the end of last year.  Has clear/white discharge, has odor.  Denies dysuria.   Past Medical History: Past Medical History:  Diagnosis Date  . Chlamydia   . Gonorrhea     Past obstetric history: OB History  Gravida Para Term Preterm AB Living  1 1 1         SAB TAB Ectopic Multiple Live Births               # Outcome Date GA Lbr Len/2nd Weight Sex Delivery Anes PTL Lv  1 Term 01/01/11 [redacted]w[redacted]d  3.062 kg (6 lb 12 oz)   Vag-Spont         Past Surgical History: Past Surgical History:  Procedure Laterality Date  . WISDOM TOOTH EXTRACTION      Family History: History reviewed. No pertinent family history.  Social History: Social History  Substance Use Topics  . Smoking status: Current Every Day Smoker    Packs/day: 0.25    Types: Cigarettes  . Smokeless tobacco: Never Used  . Alcohol use No    Allergies: No Known Allergies  Meds:  Prescriptions Prior to Admission  Medication Sig Dispense Refill Last Dose  . clindamycin (CLEOCIN) 150 MG capsule Take 2 capsules (300 mg total) by mouth 3 (three) times daily. May dispense as 150mg  capsules 60 capsule 0   . Homeopathic Products (EAR PAIN RELIEF HOMEOPATHIC) SOLN Place 1 drop in ear(s) 2 (two) times daily as needed (pain).   11/18/2014 at Unknown time  . HYDROcodone-acetaminophen (HYCET) 7.5-325 mg/15 ml solution Take 15 mLs by mouth every 8 (eight) hours as needed for moderate pain (Do not drive after taking). 120 mL 0   . ibuprofen (ADVIL,MOTRIN) 200 MG tablet Take 200 mg by mouth every 6 (six) hours as needed for moderate pain.   11/17/2014 at Unknown time  . naproxen (NAPROSYN) 500 MG tablet Take 1 tablet (500 mg total) by mouth 2 (two) times  daily. 30 tablet 0   . norethindrone-ethinyl estradiol 1/35 (ORTHO-NOVUM, NORTREL,CYCLAFEM) tablet 1 pill bid for 1 week then 1 daily for 1 wk then start new pk at 1 tab daily. (Patient not taking: Reported on 11/18/2014) 1 Package 1 Not Taking at Unknown time    I have reviewed patient's Past Medical Hx, Surgical Hx, Family Hx, Social Hx, medications and allergies.  ROS:  Review of Systems  Constitutional: Negative for fever.  Gastrointestinal: Positive for abdominal pain. Negative for constipation, diarrhea, nausea and vomiting.  Genitourinary: Positive for pelvic pain and vaginal discharge. Negative for dysuria, hematuria and vaginal bleeding.  Musculoskeletal: Negative for myalgias.   Other systems  negative     Physical Exam  Patient Vitals for the past 24 hrs:  BP Temp Temp src Pulse Resp Height Weight  05/04/16 0847 130/68 97.8 F (36.6 C) Oral 65 16 - -  05/04/16 0832 - - - - - 5\' 6"  (1.676 m) 66.7 kg (147 lb)   Constitutional: Well-developed, well-nourished female in no acute distress.  Cardiovascular: normal rate and rhythm, no ectopy audible, S1 & S2 heard, no murmur Respiratory: normal effort, no distress. Lungs CTAB with no wheezes or crackles GI: Abd soft, non-tender.  Nondistended.  No rebound, No guarding.  Bowel Sounds audible  MS: Extremities nontender, no edema, normal ROM Neurologic: Alert and oriented x 4.   Grossly nonfocal. GU: Neg CVAT. Skin:  Warm and Dry Psych:  Affect appropriate.  PELVIC EXAM: Cervix pink, visually closed, without lesion, scant white creamy discharge, vaginal walls and external genitalia normal Bimanual exam: Cervix firm, anterior, neg CMT, uterus nontender, nonenlarged, adnexa without tenderness, enlargement, or mass    Labs: Results for orders placed or performed during the hospital encounter of 05/04/16 (from the past 24 hour(s))  Urinalysis, Routine w reflex microscopic (not at [facility])     Status: None   Collection Time: 05/04/16  8:32 AM  Result Value Ref Range   Color, Urine YELLOW YELLOW   APPearance CLEAR CLEAR   Specific Gravity, Urine 1.025 1.005 - 1.030   pH 6.0 5.0 - 8.0   Glucose, UA NEGATIVE NEGATIVE mg/dL   Hgb urine dipstick NEGATIVE NEGATIVE   Bilirubin Urine NEGATIVE NEGATIVE   Ketones, ur NEGATIVE NEGATIVE mg/dL   Protein, ur NEGATIVE NEGATIVE mg/dL   Nitrite NEGATIVE NEGATIVE   Leukocytes, UA NEGATIVE NEGATIVE  Pregnancy, urine POC     Status: None   Collection Time: 05/04/16  9:00 AM  Result Value Ref Range   Preg Test, Ur NEGATIVE NEGATIVE  Wet prep, genital     Status: Abnormal   Collection Time: 05/04/16 10:00 AM  Result Value Ref Range   Yeast Wet Prep HPF POC NONE SEEN NONE SEEN   Trich, Wet Prep  NONE SEEN NONE SEEN   Clue Cells Wet Prep HPF POC PRESENT (A) NONE SEEN   WBC, Wet Prep HPF POC MANY (A) NONE SEEN   Sperm NONE SEEN   CBC with Differential/Platelet     Status: None   Collection Time: 05/04/16 10:12 AM  Result Value Ref Range   WBC 5.5 4.0 - 10.5 K/uL   RBC 4.33 3.87 - 5.11 MIL/uL   Hemoglobin 14.0 12.0 - 15.0 g/dL   HCT 16.139.4 09.636.0 - 04.546.0 %   MCV 91.0 78.0 - 100.0 fL   MCH 32.3 26.0 - 34.0 pg   MCHC 35.5 30.0 - 36.0 g/dL   RDW 40.912.1 81.111.5 - 91.415.5 %   Platelets 236 150 -  400 K/uL   Neutrophils Relative % 49 %   Neutro Abs 2.7 1.7 - 7.7 K/uL   Lymphocytes Relative 43 %   Lymphs Abs 2.4 0.7 - 4.0 K/uL   Monocytes Relative 7 %   Monocytes Absolute 0.4 0.1 - 1.0 K/uL   Eosinophils Relative 1 %   Eosinophils Absolute 0.0 0.0 - 0.7 K/uL   Basophils Relative 0 %   Basophils Absolute 0.0 0.0 - 0.1 K/uL    Imaging:  No results found.  MAU Course/MDM: I have ordered labs as follows: see above. She was worried she was bleeding too much so I sent a CBC, also to see WBC due to pain. Both normal   Reassured patient.  Wet prep showed BV Imaging ordered: none Results reviewed.   Long discussion of risks of unprotected intercourse including pregnancy and STDs, including HIV and Hepatitis.   Pt stable at time of discharge.  Assessment: Bacterial vaginosis Risky sexual behavior Pelvic pain, no evidence of acute abdominal infection  Plan: Discharge home Recommend safe sex/condom use Rx sent for Flagyl for BV with alcohol warning. Referred to [facility] for [facility] clinic Recommend using contraception with new future partner until she gets to know him well enough to conceive with him  Encouraged to return here or to other Urgent Care/ED if she develops worsening of symptoms, increase in pain, fever, or other concerning symptoms.   Wynelle Bourgeois CNM, MSN Certified Nurse-Midwife 05/04/2016 8:59 AM

## 2016-05-04 NOTE — MAU Provider Note (Signed)
History   Abdominal Pain: Kristin Kline is a 25 yo G1P1 with a PMH of gonorrhea and chlamydia who presents today with the chief complaint of abdominal pain and vaginal discharge.   The abdominal pain is described as cramping, and is 5/10 in intensity. Pain is located in the suprapubic, diffusely without radiation. Onset was a few months ago and is worse several days before her period.   Symptoms have been unchanged since. Her last menstrual period was at the beginning of August and periods have been regular.  Aggravating factors: mensturation.  Alleviating factors: NSAIDs. Associated symptoms: none. The patient denies constipation, diarrhea, fever, headache, nausea and vomiting.  The patient describes the discharge as a clear, white mucous which has been present for a few weeks. She reports a history of trichomonas last month for which she received treatment [facility] the [facility]. She also reports a history of gonorrhea and chlamydia.  Of note, the patient discontinued her birth control pills at the end of last year in hopes of getting pregnant.  She is no longer involved with her previous boyfriend and states that they had intercourse 1 time after she was initially treated for the trichomonas.   CSN: [id number]  Arrival date and time: 05/04/16 4098   First Provider Initiated Contact with Patient 05/04/16 [phone]      Chief Complaint  Patient presents with  . Abdominal Pain   HPI  OB History    Gravida Para Term Preterm AB Living   1 1 1          SAB TAB Ectopic Multiple Live Births                  Past Medical History:  Diagnosis Date  . Chlamydia   . Gonorrhea     Past Surgical History:  Procedure Laterality Date  . WISDOM TOOTH EXTRACTION      History reviewed. No pertinent family history.  Social History  Substance Use Topics  . Smoking status: Current Every Day Smoker    Packs/day: 0.25    Types: Cigarettes  . Smokeless tobacco: Never Used  . Alcohol use  No    Allergies: No Known Allergies  Prescriptions Prior to Admission  Medication Sig Dispense Refill Last Dose  . ibuprofen (ADVIL,MOTRIN) 200 MG tablet Take 400 mg by mouth every 6 (six) hours as needed for moderate pain or cramping.    Past Month at Unknown time    ROS Physical Exam   Blood pressure 130/68, pulse 65, temperature 97.8 F (36.6 C), temperature source Oral, resp. rate 16, height 5\' 6"  (1.676 m), weight 66.7 kg (147 lb), last menstrual period 04/07/2016.  Physical Exam  Constitutional: She is oriented to person, place, and time. She appears well-developed and well-nourished. No distress.  HENT:  Head: Normocephalic and atraumatic.  Eyes: EOM are normal.  Cardiovascular: Normal rate, regular rhythm and normal heart sounds.  Exam reveals no gallop and no friction rub.   No murmur heard. Respiratory: Effort normal and breath sounds normal. No respiratory distress. She has no wheezes. She has no rales.  GI: Soft. Bowel sounds are normal. She exhibits no distension and no mass. There is tenderness. There is no rebound and no guarding.  Genitourinary: Uterus normal. Pelvic exam was performed with patient prone.  Cervix is parous. Cervix exhibits no motion tenderness and no friability. Right adnexum displays no mass and no tenderness. Left adnexum displays no mass and no tenderness. Vagina exhibits no lesion. No bleeding  in the vagina. Vaginal discharge found.  Neurological: She is alert and oriented to person, place, and time.  Skin: Skin is warm and dry. No rash noted. No erythema.  Psychiatric: She has a normal mood and affect. Her behavior is normal.    Vitals:   05/04/16 0847  BP: 130/68  Pulse: 65  Resp: 16  Temp: 97.8 F (36.6 C)   Physical Exam  Constitutional: She is oriented to person, place, and time. She appears well-developed and well-nourished. No distress.  Genitourinary: Uterus normal. Pelvic exam was performed with patient prone. Vagina exhibits no  lesion. No bleeding in the vagina. Vaginal discharge found. Right adnexum does not display mass and does not display tenderness. Left adnexum does not display mass and does not display tenderness.  Cervix is parous. Cervix does not exhibit motion tenderness or friability.  HENT:  Head: Normocephalic and atraumatic.  Eyes: EOM are normal.  Cardiovascular: Normal rate, regular rhythm and normal heart sounds.  Exam reveals no gallop and no friction rub.   No murmur heard. Pulmonary/Chest: Effort normal and breath sounds normal. No respiratory distress. She has no wheezes. She has no rales.  Abdominal: Soft. Bowel sounds are normal. She exhibits no distension and no mass. There is tenderness. There is no rebound and no guarding.  Neurological: She is alert and oriented to person, place, and time.  Skin: Skin is warm and dry. No rash noted. No erythema.  Psychiatric: She has a normal mood and affect. Her behavior is normal.     Results for orders placed or performed during the hospital encounter of 05/04/16 (from the past 24 hour(s))  Urinalysis, Routine w reflex microscopic (not at [facility])     Status: None   Collection Time: 05/04/16  8:32 AM  Result Value Ref Range   Color, Urine YELLOW YELLOW   APPearance CLEAR CLEAR   Specific Gravity, Urine 1.025 1.005 - 1.030   pH 6.0 5.0 - 8.0   Glucose, UA NEGATIVE NEGATIVE mg/dL   Hgb urine dipstick NEGATIVE NEGATIVE   Bilirubin Urine NEGATIVE NEGATIVE   Ketones, ur NEGATIVE NEGATIVE mg/dL   Protein, ur NEGATIVE NEGATIVE mg/dL   Nitrite NEGATIVE NEGATIVE   Leukocytes, UA NEGATIVE NEGATIVE  Pregnancy, urine POC     Status: None   Collection Time: 05/04/16  9:00 AM  Result Value Ref Range   Preg Test, Ur NEGATIVE NEGATIVE  Wet prep, genital     Status: Abnormal   Collection Time: 05/04/16 10:00 AM  Result Value Ref Range   Yeast Wet Prep HPF POC NONE SEEN NONE SEEN   Trich, Wet Prep NONE SEEN NONE SEEN   Clue Cells Wet Prep HPF POC PRESENT (A)  NONE SEEN   WBC, Wet Prep HPF POC MANY (A) NONE SEEN   Sperm NONE SEEN   CBC with Differential/Platelet     Status: None (Preliminary result)   Collection Time: 05/04/16 10:12 AM  Result Value Ref Range   WBC 5.5 4.0 - 10.5 K/uL   RBC 4.33 3.87 - 5.11 MIL/uL   Hemoglobin 14.0 12.0 - 15.0 g/dL   HCT 16.1 09.6 - 04.5 %   MCV 91.0 78.0 - 100.0 fL   MCH 32.3 26.0 - 34.0 pg   MCHC 35.5 30.0 - 36.0 g/dL   RDW 40.9 81.1 - 91.4 %   Platelets 236 150 - 400 K/uL   Neutrophils Relative % 49 %   Neutro Abs 2.7 1.7 - 7.7 K/uL   Lymphocytes Relative 43 %  Lymphs Abs 2.4 0.7 - 4.0 K/uL   Monocytes Relative 7 %   Monocytes Absolute 0.4 0.1 - 1.0 K/uL   Eosinophils Relative 1 %   Eosinophils Absolute 0.0 0.0 - 0.7 K/uL   Basophils Relative 0 %   Basophils Absolute 0.0 0.0 - 0.1 K/uL   Other PENDING %     MAU Course  Procedures  MDM Given the patient's sexual activity and prior history of STDs, a wet prep and testing for Gc/chlamydia were obtained.  A CBC was also obtained to rule out infection or PID.  Assessment and Plan  1. Dysmenorrhea  - NSAIDs   -heat packs  2. Clue cells present on Wet Prep.  - treat for BV with Metronidazole 500mg  PO BID  for 7 days.  -pt counseled on safe sex practices and barrier  contraceptive use  Discharge and have pt return if symptoms worsen or severe abdominal pain develops.  Richard Godine  PA-S 05/04/2016, 10:19 AM   The patient was seen and examined by me also Agree with note Please see my note for official documentation.  Ready for discharge Will have her followup in clinic as scheduled Aviva SignsMarie L Williams, CNM

## 2016-05-05 LAB — GC/CHLAMYDIA PROBE AMP (~~LOC~~) NOT AT ARMC
CHLAMYDIA, DNA PROBE: NEGATIVE
Neisseria Gonorrhea: NEGATIVE

## 2016-05-05 LAB — HIV ANTIBODY (ROUTINE TESTING W REFLEX): HIV Screen 4th Generation wRfx: NONREACTIVE

## 2016-09-04 ENCOUNTER — Inpatient Hospital Stay (HOSPITAL_COMMUNITY)
Admission: AD | Admit: 2016-09-04 | Discharge: 2016-09-04 | Disposition: A | Discharge status: Home / Self Care | Payer: Medicaid Other | Source: Ambulatory Visit | Attending: Obstetrics and Gynecology | Admitting: Obstetrics and Gynecology

## 2016-09-04 DIAGNOSIS — Z202 Contact with and (suspected) exposure to infections with a predominantly sexual mode of transmission: Secondary | ICD-10-CM | POA: Diagnosis not present

## 2016-09-04 DIAGNOSIS — Z7251 High risk heterosexual behavior: Secondary | ICD-10-CM

## 2016-09-04 DIAGNOSIS — Z8049 Family history of malignant neoplasm of other genital organs: Secondary | ICD-10-CM

## 2016-09-04 DIAGNOSIS — Z808 Family history of malignant neoplasm of other organs or systems: Secondary | ICD-10-CM | POA: Insufficient documentation

## 2016-09-04 NOTE — MAU Note (Signed)
Pt presents to MAU stating that she needs STD check due to unprotected intercourse on November the 4th.. Reports white thin vaginal discharge.

## 2016-09-04 NOTE — MAU Provider Note (Signed)
[name] is a 25 y.o. 371P1000 non-pregnant female here for STD screen and pap test. She reports one episode of unprotected sex in November. She also reports family hx of cervical cancer and wants screening. The patient denies abdominal or pelvic pain today.   BP 126/73   Pulse 81   Temp 98.2 F (36.8 C)   Resp 16   LMP 08/28/2016   CONSTITUTIONAL: Well-developed, well-nourished female in no acute distress.  ENT: External right and left ear normal.  EYES: EOM intact, conjunctivae normal.  MUSCULOSKELETAL: Normal range of motion.  CARDIOVASCULAR: Regular heart rate RESPIRATORY: Normal effort NEUROLOGICAL: Alert and oriented to person, place, and time.  SKIN: Skin is warm and dry. No rash noted. Not diaphoretic. No erythema. No pallor. PSYCH: Normal mood and affect. Normal behavior. Normal judgment and thought content.  No results found for this or any previous visit (from the past 24 hour(s)).  MDM Patient advised that without concerning symptoms today routine STD screen and pap screen are not performed in MAU. [facility].   A: Unprotected intercourse Family hx of cervical cancer  P: [facility] Patient may return to MAU as needed or if her condition were to change or worsen  Return for MAU for OBGYN emergencies  Donette LarryMelanie Crissa Sowder, CNM  09/04/2016 12:24 PM

## 2017-01-20 ENCOUNTER — Encounter (HOSPITAL_COMMUNITY): Payer: Self-pay

## 2017-01-20 ENCOUNTER — Inpatient Hospital Stay (HOSPITAL_COMMUNITY)
Admission: AD | Admit: 2017-01-20 | Discharge: 2017-01-20 | Disposition: A | Discharge status: Home / Self Care | Payer: Medicaid Other | Source: Ambulatory Visit | Attending: Obstetrics and Gynecology | Admitting: Obstetrics and Gynecology

## 2017-01-20 DIAGNOSIS — N760 Acute vaginitis: Secondary | ICD-10-CM | POA: Diagnosis not present

## 2017-01-20 DIAGNOSIS — A599 Trichomoniasis, unspecified: Secondary | ICD-10-CM | POA: Insufficient documentation

## 2017-01-20 DIAGNOSIS — F17210 Nicotine dependence, cigarettes, uncomplicated: Secondary | ICD-10-CM | POA: Diagnosis not present

## 2017-01-20 DIAGNOSIS — B9689 Other specified bacterial agents as the cause of diseases classified elsewhere: Secondary | ICD-10-CM | POA: Diagnosis not present

## 2017-01-20 DIAGNOSIS — R109 Unspecified abdominal pain: Secondary | ICD-10-CM | POA: Diagnosis not present

## 2017-01-20 HISTORY — DX: Acute vaginitis: N76.0

## 2017-01-20 HISTORY — DX: Other specified bacterial agents as the cause of diseases classified elsewhere: B96.89

## 2017-01-20 HISTORY — DX: Trichomoniasis, unspecified: A59.9

## 2017-01-20 LAB — WET PREP, GENITAL
Sperm: NONE SEEN
YEAST WET PREP: NONE SEEN

## 2017-01-20 LAB — CBC WITH DIFFERENTIAL/PLATELET
BASOS ABS: 0 10*3/uL (ref 0.0–0.1)
Basophils Relative: 0 %
Eosinophils Absolute: 0 10*3/uL (ref 0.0–0.7)
Eosinophils Relative: 1 %
HEMATOCRIT: 40.7 % (ref 36.0–46.0)
Hemoglobin: 14.1 g/dL (ref 12.0–15.0)
Lymphocytes Relative: 41 %
Lymphs Abs: 2.7 10*3/uL (ref 0.7–4.0)
MCH: 32.2 pg (ref 26.0–34.0)
MCHC: 34.6 g/dL (ref 30.0–36.0)
MCV: 92.9 fL (ref 78.0–100.0)
MONOS PCT: 5 %
Monocytes Absolute: 0.3 10*3/uL (ref 0.1–1.0)
Neutro Abs: 3.4 10*3/uL (ref 1.7–7.7)
Neutrophils Relative %: 53 %
Platelets: 224 10*3/uL (ref 150–400)
RBC: 4.38 MIL/uL (ref 3.87–5.11)
RDW: 12.2 % (ref 11.5–15.5)
WBC: 6.5 10*3/uL (ref 4.0–10.5)

## 2017-01-20 LAB — URINALYSIS, ROUTINE W REFLEX MICROSCOPIC
Bilirubin Urine: NEGATIVE
GLUCOSE, UA: NEGATIVE mg/dL
Hgb urine dipstick: NEGATIVE
Ketones, ur: NEGATIVE mg/dL
Nitrite: NEGATIVE
PROTEIN: NEGATIVE mg/dL
Specific Gravity, Urine: 1.012 (ref 1.005–1.030)
pH: 7 (ref 5.0–8.0)

## 2017-01-20 LAB — COMPREHENSIVE METABOLIC PANEL
ALBUMIN: 4.1 g/dL (ref 3.5–5.0)
ALT: 15 U/L (ref 14–54)
AST: 17 U/L (ref 15–41)
Alkaline Phosphatase: 59 U/L (ref 38–126)
Anion gap: 5 (ref 5–15)
BILIRUBIN TOTAL: 0.9 mg/dL (ref 0.3–1.2)
BUN: 10 mg/dL (ref 6–20)
CO2: 28 mmol/L (ref 22–32)
CREATININE: 0.82 mg/dL (ref 0.44–1.00)
Calcium: 9.2 mg/dL (ref 8.9–10.3)
Chloride: 104 mmol/L (ref 101–111)
GFR calc Af Amer: >60 mL/min (ref >60)
GLUCOSE: 98 mg/dL (ref 65–99)
POTASSIUM: 3.9 mmol/L (ref 3.5–5.1)
Sodium: 137 mmol/L (ref 135–145)
Total Protein: 7.5 g/dL (ref 6.5–8.1)

## 2017-01-20 LAB — POCT PREGNANCY, URINE: Preg Test, Ur: NEGATIVE

## 2017-01-20 MED ORDER — METRONIDAZOLE 500 MG PO TABS
2000.0000 mg | ORAL_TABLET | Freq: Once | ORAL | Status: AC
  Administered 2017-01-20: 2000 mg via ORAL
  Filled 2017-01-20: qty 4

## 2017-01-20 NOTE — MAU Note (Signed)
Has come to MAU before and provider told her it might be cramps before a period. Been having cramps off and on since her last period on May 1, has tried ibuprofen but it didn't help.

## 2017-01-20 NOTE — Discharge Instructions (Signed)
AVOID ANY ALCOHOL DRINKING UNTIL Saturday 01/23/2017.

## 2017-01-20 NOTE — MAU Provider Note (Signed)
History     CSN: [id number]  Arrival date and time: 01/20/17 1336   First Provider Initiated Contact with Patient 01/20/17 1455      Chief Complaint  Patient presents with  . Abdominal Cramping   Ms. Kristin Kline is a 26 yo [id number] non-pregnant female presenting with complaints of intermittent abdominal pain since 01/05/17. LMP was 01/05/17.  Ibuprofen has not touched her pain today.  She denies constipation, diarrhea, nausea, or vomiting.  Abdominal Cramping  This is a recurrent problem. The current episode started 1 to 4 weeks ago. The onset quality is gradual. The problem occurs intermittently. The most recent episode lasted 15 days. The problem has been gradually worsening. The pain is located in the suprapubic region. The pain is at a severity of 6/10. The pain is moderate. The quality of the pain is cramping. The abdominal pain does not radiate. Pertinent negatives include no constipation or diarrhea. Nothing aggravates the pain. The pain is relieved by nothing. She has tried nothing for the symptoms. H/O GC & CT    Past Medical History:  Diagnosis Date  . Chlamydia   . Gonorrhea     Past Surgical History:  Procedure Laterality Date  . WISDOM TOOTH EXTRACTION      Family History  Problem Relation Age of Onset  . Diabetes Mother     Social History  Substance Use Topics  . Smoking status: Current Every Day Smoker    Packs/day: 0.25    Types: Cigarettes  . Smokeless tobacco: Never Used  . Alcohol use Not on file     Comment: socially    Allergies: No Known Allergies  Prescriptions Prior to Admission  Medication Sig Dispense Refill Last Dose  . ibuprofen (ADVIL,MOTRIN) 200 MG tablet Take 400 mg by mouth every 6 (six) hours as needed for moderate pain or cramping.    Past Month at Unknown time    Review of Systems  Constitutional: Negative.   HENT: Negative.   Eyes: Negative.   Respiratory: Negative.   Cardiovascular: Negative.   Gastrointestinal: Positive  for abdominal pain. Negative for constipation and diarrhea.  Endocrine: Negative.   Genitourinary: Positive for menstrual problem (cramping since period started 5/1) and pelvic pain.  Musculoskeletal: Negative.   Skin: Negative.   Allergic/Immunologic: Negative.   Neurological: Negative.   Hematological: Negative.   Psychiatric/Behavioral: Negative.    Physical Exam   Blood pressure 125/73, pulse 69, temperature 98 F (36.7 C), temperature source Oral, resp. rate 16, height 5\' 6"  (1.676 m), weight 130 lb (59 kg).  Physical Exam  Constitutional: She is oriented to person, place, and time. She appears well-developed and well-nourished.  HENT:  Head: Normocephalic.  Eyes: Pupils are equal, round, and reactive to light.  Neck: Normal range of motion.  Cardiovascular: Normal rate, regular rhythm, normal heart sounds and intact distal pulses.   Respiratory: Effort normal and breath sounds normal.  GI: Soft. Bowel sounds are normal.  Genitourinary:  Genitourinary Comments: Scant, white, thick vag d/c, cervix slightly red, no lesions, no CMT or friability.  Musculoskeletal: Normal range of motion.  Neurological: She is alert and oriented to person, place, and time. She has normal reflexes.  Psychiatric: She has a normal mood and affect. Her behavior is normal. Judgment and thought content normal.    Results for orders placed or performed during the hospital encounter of 01/20/17 (from the past 24 hour(s))  Urinalysis, Routine w reflex microscopic     Status: Abnormal   Collection  Time: 01/20/17  1:45 PM  Result Value Ref Range   Color, Urine YELLOW YELLOW   APPearance CLEAR CLEAR   Specific Gravity, Urine 1.012 1.005 - 1.030   pH 7.0 5.0 - 8.0   Glucose, UA NEGATIVE NEGATIVE mg/dL   Hgb urine dipstick NEGATIVE NEGATIVE   Bilirubin Urine NEGATIVE NEGATIVE   Ketones, ur NEGATIVE NEGATIVE mg/dL   Protein, ur NEGATIVE NEGATIVE mg/dL   Nitrite NEGATIVE NEGATIVE   Leukocytes, UA  MODERATE (A) NEGATIVE   RBC / HPF 0-5 0 - 5 RBC/hpf   WBC, UA 6-30 0 - 5 WBC/hpf   Bacteria, UA RARE (A) NONE SEEN   Squamous Epithelial / LPF 0-5 (A) NONE SEEN  Pregnancy, urine POC     Status: None   Collection Time: 01/20/17  2:51 PM  Result Value Ref Range   Preg Test, Ur NEGATIVE NEGATIVE  CBC with Differential/Platelet     Status: None   Collection Time: 01/20/17  3:03 PM  Result Value Ref Range   WBC 6.5 4.0 - 10.5 K/uL   RBC 4.38 3.87 - 5.11 MIL/uL   Hemoglobin 14.1 12.0 - 15.0 g/dL   HCT 16.1 09.6 - 04.5 %   MCV 92.9 78.0 - 100.0 fL   MCH 32.2 26.0 - 34.0 pg   MCHC 34.6 30.0 - 36.0 g/dL   RDW 40.9 81.1 - 91.4 %   Platelets 224 150 - 400 K/uL   Neutrophils Relative % 53 %   Neutro Abs 3.4 1.7 - 7.7 K/uL   Lymphocytes Relative 41 %   Lymphs Abs 2.7 0.7 - 4.0 K/uL   Monocytes Relative 5 %   Monocytes Absolute 0.3 0.1 - 1.0 K/uL   Eosinophils Relative 1 %   Eosinophils Absolute 0.0 0.0 - 0.7 K/uL   Basophils Relative 0 %   Basophils Absolute 0.0 0.0 - 0.1 K/uL  Comprehensive metabolic panel     Status: None   Collection Time: 01/20/17  3:03 PM  Result Value Ref Range   Sodium 137 135 - 145 mmol/L   Potassium 3.9 3.5 - 5.1 mmol/L   Chloride 104 101 - 111 mmol/L   CO2 28 22 - 32 mmol/L   Glucose, Bld 98 65 - 99 mg/dL   BUN 10 6 - 20 mg/dL   Creatinine, Ser 7.82 0.44 - 1.00 mg/dL   Calcium 9.2 8.9 - 95.6 mg/dL   Total Protein 7.5 6.5 - 8.1 g/dL   Albumin 4.1 3.5 - 5.0 g/dL   AST 17 15 - 41 U/L   ALT 15 14 - 54 U/L   Alkaline Phosphatase 59 38 - 126 U/L   Total Bilirubin 0.9 0.3 - 1.2 mg/dL   GFR calc non Af Amer >60 >60 mL/min   GFR calc Af Amer >60 >60 mL/min   Anion gap 5 5 - 15  Wet prep, genital     Status: Abnormal   Collection Time: 01/20/17  3:04 PM  Result Value Ref Range   Yeast Wet Prep HPF POC NONE SEEN NONE SEEN   Trich, Wet Prep PRESENT (A) NONE SEEN   Clue Cells Wet Prep HPF POC PRESENT (A) NONE SEEN   WBC, Wet Prep HPF POC MODERATE (A) NONE  SEEN   Sperm NONE SEEN    MAU Course  Procedures  MDM CCUA - no UTI CBC with Diff - WNL CMP - WNL HIV -pending Wet Prep - (+) Trich & (+) BV GC/CT - pending Flagyl 2 grams po now  Assessment and Plan  Bacterial vaginitis  Trichomoniasis -Tx both with Flagyl 2 grams po  - Instructions on BV and Trichomoniasis - Partner will need to be treated - Abstain from unprotected IC with partner x 72 hours after he is treated - Return to MAU for OB/GYN emergencies - Patient verbalized an understanding of the plan of care and agrees.   Raelyn Mora MSN, CNM 01/20/2017, 3:06 PM

## 2017-01-21 LAB — HIV ANTIBODY (ROUTINE TESTING W REFLEX): HIV Screen 4th Generation wRfx: NONREACTIVE

## 2017-01-21 LAB — GC/CHLAMYDIA PROBE AMP (~~LOC~~) NOT AT ARMC
CHLAMYDIA, DNA PROBE: NEGATIVE
NEISSERIA GONORRHEA: NEGATIVE

## 2017-07-02 ENCOUNTER — Ambulatory Visit: Payer: Medicaid Other | Admitting: Family Medicine

## 2017-07-27 ENCOUNTER — Encounter (HOSPITAL_COMMUNITY): Payer: Self-pay | Admitting: *Deleted

## 2017-07-27 ENCOUNTER — Inpatient Hospital Stay (HOSPITAL_COMMUNITY)
Admission: AD | Admit: 2017-07-27 | Discharge: 2017-07-27 | Disposition: A | Discharge status: Home / Self Care | Payer: Medicaid Other | Source: Ambulatory Visit | Attending: Family Medicine | Admitting: Family Medicine

## 2017-07-27 DIAGNOSIS — R102 Pelvic and perineal pain: Secondary | ICD-10-CM | POA: Diagnosis present

## 2017-07-27 DIAGNOSIS — B9689 Other specified bacterial agents as the cause of diseases classified elsewhere: Secondary | ICD-10-CM | POA: Insufficient documentation

## 2017-07-27 DIAGNOSIS — N760 Acute vaginitis: Secondary | ICD-10-CM

## 2017-07-27 LAB — URINALYSIS, ROUTINE W REFLEX MICROSCOPIC
BILIRUBIN URINE: NEGATIVE
Glucose, UA: NEGATIVE mg/dL
HGB URINE DIPSTICK: NEGATIVE
KETONES UR: NEGATIVE mg/dL
Leukocytes, UA: NEGATIVE
Nitrite: NEGATIVE
PROTEIN: NEGATIVE mg/dL
Specific Gravity, Urine: 1.02 (ref 1.005–1.030)
pH: 6 (ref 5.0–8.0)

## 2017-07-27 LAB — WET PREP, GENITAL
Sperm: NONE SEEN
Trich, Wet Prep: NONE SEEN
Yeast Wet Prep HPF POC: NONE SEEN

## 2017-07-27 LAB — POCT PREGNANCY, URINE: Preg Test, Ur: NEGATIVE

## 2017-07-27 MED ORDER — METRONIDAZOLE 500 MG PO TABS: 500.0000 mg | ORAL_TABLET | Freq: Two times a day (BID) | ORAL | 0 refills | Status: DC

## 2017-07-27 NOTE — Discharge Instructions (Signed)
In late 2019, the [facility] will be moving to the [place] campus. At that time, the MAU (Maternity Admissions Unit), where you are being seen today, will no longer see non-pregnant patients. We strongly encourage you to find a doctor's office before that time, so that you can be seen with any GYN concerns, like vaginal discharge, urinary tract infection, etc.. in a timely manner.  ° °In order to make the office visit more convenient, the [facility] will be offering evening hours from 4pm-7:30pm on Monday. There will be same-day appointments, walk-in appointments and scheduled appointments available during this time. We will be adding more evening hours over the next year before the move.  ° °[facility] - [phone] ° °For urgent needs, [facility] is also available for management of urgent GYN complaints such as vaginal discharge or urinary tract infections.  ° °Primary care follow up  °[facility] (will see you even if you do not have sickle cell): [phone] °[facility]: [phone] °[facility]: [phone] ° ° ° °Bacterial Vaginosis °Bacterial vaginosis is an infection of the vagina. It happens when too many germs (bacteria) grow in the vagina. This infection puts you at risk for infections from sex (STIs). Treating this infection can lower your risk for some STIs. You should also treat this if you are pregnant. It can cause your baby to be born early. °Follow these instructions at home: °Medicines °· Take over-the-counter and prescription medicines only as told by your doctor. °· Take or use your antibiotic medicine as told by your doctor. Do not stop taking or using it even if you start to feel better. °General instructions °· If you your sexual partner is a woman, tell her that you have this infection. She needs to get treatment if she has symptoms. If you have a  female partner, he does not need to be treated. °· During treatment: °? Avoid sex. °? Do not douche. °? Avoid alcohol as told. °? Avoid breastfeeding as told. °· Drink enough fluid to keep your pee (urine) clear or pale yellow. °· Keep your vagina and butt (rectum) clean. °? Wash the area with warm water every day. °? Wipe from front to back after you use the toilet. °· Keep all follow-up visits as told by your doctor. This is important. °Preventing this condition °· Do not douche. °· Use only warm water to wash around your vagina. °· Use protection when you have sex. This includes: °? Latex condoms. °? Dental dams. °· Limit how many people you have sex with. It is best to only have sex with the same person (be monogamous). °· Get tested for STIs. Have your partner get tested. °· Wear underwear that is cotton or lined with cotton. °· Avoid tight pants and pantyhose. This is most important in summer. °· Do not use any products that have nicotine or tobacco in them. These include cigarettes and e-cigarettes. If you need help quitting, ask your doctor. °· Do not use illegal drugs. °· Limit how much alcohol you drink. °Contact a doctor if: °· Your symptoms do not get better, even after you are treated. °· You have more discharge or pain when you pee (urinate). °· You have a fever. °· You have pain in your belly (abdomen). °· You have pain with sex. °· Your bleed from your vagina between periods. °Summary °· This infection happens when too many   germs (bacteria) grow in the vagina. °· Treating this condition can lower your risk for some infections from sex (STIs). °· You should also treat this if you are pregnant. It can cause early (premature) birth. °· Do not stop taking or using your antibiotic medicine even if you start to feel better. °This information is not intended to replace advice given to you by your health care provider. Make sure you discuss any questions you have with your health care provider. °Document  Released: 06/02/2008 Document Revised: 05/09/2016 Document Reviewed: 05/09/2016 °[employer]vier Interactive Patient Education © 2017 [employer] ° ° °

## 2017-07-27 NOTE — MAU Provider Note (Signed)
History     CSN: [id number]  Arrival date and time: 07/27/17 [id number]   First Provider Initiated Contact with Patient 07/27/17 [phone]      Chief Complaint  Patient presents with  . Pelvic Pain  . Dysuria   Kristin Kline is a 26 y.o. G1P1001 who presents today with left lower abdominal pain. She states that she has had this pain off and on for a "long time", but last night it returned.    Pelvic Pain  The patient's primary symptoms include pelvic pain. The patient's pertinent negatives include no vaginal discharge. This is a new problem. The current episode started yesterday. The problem occurs constantly. The problem has been gradually worsening. Pain severity now: 7/10. The problem affects the left side. She is not pregnant. Associated symptoms include headaches. Pertinent negatives include no constipation, diarrhea or fever. The symptoms are aggravated by urinating. She has tried nothing for the symptoms. She is sexually active. It is unknown whether or not her partner has an STD. She uses nothing for contraception. Her menstrual history has been regular (07/09/17).   Past Medical History:  Diagnosis Date  . Bacterial vaginitis 01/20/2017  . Chlamydia   . Gonorrhea   . Trichomoniasis 01/20/2017    Past Surgical History:  Procedure Laterality Date  . WISDOM TOOTH EXTRACTION      Family History  Problem Relation Age of Onset  . Diabetes Mother     Social History   Tobacco Use  . Smoking status: Current Every Day Smoker    Packs/day: 0.25    Types: Cigarettes  . Smokeless tobacco: Never Used  Substance Use Topics  . Alcohol use: Yes    Comment: "every other weekend"  . Drug use: No    Allergies: No Known Allergies  Medications Prior to Admission  Medication Sig Dispense Refill Last Dose  . ibuprofen (ADVIL,MOTRIN) 200 MG tablet Take 400 mg by mouth every 6 (six) hours as needed for moderate pain or cramping.    Past Week at Unknown time    Review of Systems   Constitutional: Negative for fever.  Gastrointestinal: Negative for constipation and diarrhea.  Genitourinary: Positive for pelvic pain. Negative for vaginal bleeding and vaginal discharge.  Neurological: Positive for headaches.   Physical Exam   Blood pressure 118/75, pulse 64, temperature 97.9 F (36.6 C), temperature source Oral, resp. rate 15, height 5\' 6"  (1.676 m), weight 147 lb (66.7 kg), last menstrual period 07/09/2017, SpO2 98 %.  Physical Exam  Nursing note and vitals reviewed. Constitutional: She is oriented to person, place, and time. She appears well-developed and well-nourished. No distress.  HENT:  Head: Normocephalic.  Cardiovascular: Normal rate.  Respiratory: Effort normal.  GI: Soft. There is no tenderness. There is no rebound.  Genitourinary:  Genitourinary Comments:  External: no lesion Vagina: small amount of thin, white discharge Cervix: pink, smooth, no CMT Uterus: NSSC Adnexa: NT   Neurological: She is alert and oriented to person, place, and time.  Skin: Skin is warm and dry.  Psychiatric: She has a normal mood and affect.   Results for orders placed or performed during the hospital encounter of 07/27/17 (from the past 24 hour(s))  Urinalysis, Routine w reflex microscopic     Status: None   Collection Time: 07/27/17  8:13 AM  Result Value Ref Range   Color, Urine YELLOW YELLOW   APPearance CLEAR CLEAR   Specific Gravity, Urine 1.020 1.005 - 1.030   pH 6.0 5.0 - 8.0  Glucose, UA NEGATIVE NEGATIVE mg/dL   Hgb urine dipstick NEGATIVE NEGATIVE   Bilirubin Urine NEGATIVE NEGATIVE   Ketones, ur NEGATIVE NEGATIVE mg/dL   Protein, ur NEGATIVE NEGATIVE mg/dL   Nitrite NEGATIVE NEGATIVE   Leukocytes, UA NEGATIVE NEGATIVE  Pregnancy, urine POC     Status: None   Collection Time: 07/27/17  8:18 AM  Result Value Ref Range   Preg Test, Ur NEGATIVE NEGATIVE  Wet prep, genital     Status: Abnormal   Collection Time: 07/27/17  9:38 AM  Result Value Ref  Range   Yeast Wet Prep HPF POC NONE SEEN NONE SEEN   Trich, Wet Prep NONE SEEN NONE SEEN   Clue Cells Wet Prep HPF POC PRESENT (A) NONE SEEN   WBC, Wet Prep HPF POC MANY (A) NONE SEEN   Sperm NONE SEEN     MAU Course  Procedures  MDM   Assessment and Plan   1. Bacterial vaginosis    DC home Comfort measures reviewed  RX: flagyl 500mg  BID x 7 days  Return to MAU as needed   Follow-up Information    Department, [facility].   Contact information: [address] [place] [address] [phone]            Thressa ShellerHeather Hogan 07/27/2017, 8:57 AM

## 2017-07-27 NOTE — MAU Note (Signed)
Pt reports lower abd cramping, mainly toward the left side, since last pm. Denies nausea, vomiting, fever, or diarrhea.

## 2017-07-28 LAB — GC/CHLAMYDIA PROBE AMP (~~LOC~~) NOT AT ARMC
Chlamydia: NEGATIVE
Neisseria Gonorrhea: NEGATIVE

## 2017-07-28 LAB — RPR: RPR Ser Ql: NONREACTIVE

## 2017-07-28 LAB — HIV ANTIBODY (ROUTINE TESTING W REFLEX): HIV SCREEN 4TH GENERATION: NONREACTIVE

## 2017-09-29 ENCOUNTER — Ambulatory Visit: Payer: Medicaid Other | Admitting: Family Medicine

## 2018-01-14 ENCOUNTER — Ambulatory Visit: Payer: Self-pay | Admitting: Obstetrics & Gynecology

## 2018-01-14 ENCOUNTER — Ambulatory Visit: Payer: Medicaid Other | Admitting: Obstetrics & Gynecology

## 2018-02-02 ENCOUNTER — Encounter (HOSPITAL_COMMUNITY): Payer: Self-pay

## 2018-02-21 ENCOUNTER — Ambulatory Visit: Payer: Self-pay | Admitting: Obstetrics & Gynecology

## 2018-03-01 ENCOUNTER — Ambulatory Visit (HOSPITAL_COMMUNITY): Payer: Self-pay

## 2018-03-17 ENCOUNTER — Ambulatory Visit: Payer: Self-pay | Admitting: Obstetrics & Gynecology

## 2018-03-22 ENCOUNTER — Encounter (HOSPITAL_COMMUNITY): Payer: Self-pay | Admitting: *Deleted

## 2018-03-22 ENCOUNTER — Encounter (HOSPITAL_COMMUNITY): Payer: Self-pay

## 2018-03-22 ENCOUNTER — Ambulatory Visit (HOSPITAL_COMMUNITY)
Admission: RE | Admit: 2018-03-22 | Discharge: 2018-03-22 | Disposition: A | Discharge status: Home / Self Care | Payer: Self-pay | Source: Ambulatory Visit | Attending: Obstetrics and Gynecology | Admitting: Obstetrics and Gynecology

## 2018-03-22 VITALS — BP 108/64 | Ht 66.0 in | Wt 149.0 lb

## 2018-03-22 DIAGNOSIS — R87610 Atypical squamous cells of undetermined significance on cytologic smear of cervix (ASC-US): Secondary | ICD-10-CM

## 2018-03-22 DIAGNOSIS — Z1239 Encounter for other screening for malignant neoplasm of breast: Secondary | ICD-10-CM

## 2018-03-22 DIAGNOSIS — R87810 Cervical high risk human papillomavirus (HPV) DNA test positive: Secondary | ICD-10-CM

## 2018-03-22 NOTE — Patient Instructions (Signed)
Explained breast self awareness with Kristin Kline. Patient did not need a Pap smear today due to last Pap smear was 01/25/2018. Explained the colposcopy the recommended follow-up for her abnormal Pap smear. Referred to the [facility] for a colposcopy to follow-up for her abnormal Pap smear. Appointment scheduled for Tuesday, April 05, 2018 at 0855. Patient aware of appointment and will be there. Let patient know a screening mammogram is recommended at age 27 unless clinically indicated prior. Discussed smoking cessation with patient. Referred to the [facility] and gave resources to the free smoking cessation classes at [facility]. Kristin Kline verbalized understanding.  [name], [name], RN 3:11 PM

## 2018-03-22 NOTE — Progress Notes (Signed)
Patient referred to [facility] by [facility] due to having an abnormal Pap smear on 5/21/209 that a colposcopy is recommended for follow-up.  Pap Smear: Pap smear not completed today. [facility] and ASCUS with positive HPV. Referred to the [facility] for a colposcopy to follow-up for her abnormal Pap smear. Appointment scheduled for Tuesday, April 05, 2018 at 0855. Per patient her most recent Pap smear is the only abnormal Pap smear that she has had. Last Pap smear result is in Epic.  Physical exam: Breasts Breasts symmetrical. No skin abnormalities bilateral breasts. No nipple retraction bilateral breasts. No nipple discharge bilateral breasts. No lymphadenopathy. No lumps palpated bilateral breasts. No complaints of pain or tenderness on exam. Screening mammogram recommended at age 25 unless clinically indicated prior.     Pelvic/Bimanual No Pap smear completed today since last Pap smear was 01/25/2018. Pap smear not indicated per BCCCP guidelines.   Smoking History: Patient is a current smoker. Discussed smoking cessation with patient. Referred to the [facility] and gave resources to the free smoking cessation classes at [facility].  Patient Navigation: Patient education provided. Access to services provided for patient through BCCCP program.   Breast and Cervical Cancer Risk Assessment: Patient has no family history of breast cancer, known genetic mutations, or radiation treatment to the chest before age 25. Patient has no history of cervical dysplasia, immunocompromised, or DES exposure in-utero. Breast cancer risk assessment completed. No risk calculated due to patient being less than 25 years old.

## 2018-03-23 ENCOUNTER — Encounter (HOSPITAL_COMMUNITY): Payer: Self-pay | Admitting: *Deleted

## 2018-04-05 ENCOUNTER — Other Ambulatory Visit (HOSPITAL_COMMUNITY)
Admission: RE | Admit: 2018-04-05 | Discharge: 2018-04-05 | Disposition: A | Discharge status: Home / Self Care | Payer: Medicaid Other | Source: Ambulatory Visit | Attending: Obstetrics and Gynecology | Admitting: Obstetrics and Gynecology

## 2018-04-05 ENCOUNTER — Ambulatory Visit (INDEPENDENT_AMBULATORY_CARE_PROVIDER_SITE_OTHER): Payer: Medicaid Other | Admitting: Obstetrics and Gynecology

## 2018-04-05 ENCOUNTER — Encounter: Payer: Self-pay | Admitting: Obstetrics and Gynecology

## 2018-04-05 DIAGNOSIS — N871 Moderate cervical dysplasia: Secondary | ICD-10-CM | POA: Insufficient documentation

## 2018-04-05 DIAGNOSIS — R87810 Cervical high risk human papillomavirus (HPV) DNA test positive: Secondary | ICD-10-CM | POA: Insufficient documentation

## 2018-04-05 DIAGNOSIS — R87610 Atypical squamous cells of undetermined significance on cytologic smear of cervix (ASC-US): Secondary | ICD-10-CM | POA: Diagnosis not present

## 2018-04-05 DIAGNOSIS — N72 Inflammatory disease of cervix uteri: Secondary | ICD-10-CM | POA: Diagnosis not present

## 2018-04-05 LAB — POCT PREGNANCY, URINE: PREG TEST UR: NEGATIVE

## 2018-04-05 NOTE — Progress Notes (Signed)
    GYNECOLOGY CLINIC COLPOSCOPY PROCEDURE NOTE  27 y.o. G1P1001 here for colposcopy for ASCUS with POSITIVE high risk HPV pap smear on [facility] at [facility]. Discussed role for HPV in cervical dysplasia, need for surveillance.  UPT negative  Patient given informed consent, signed copy in the chart, time out was performed.  Placed in lithotomy position. Cervix viewed with speculum and colposcope after application of acetic acid.   Colposcopy adequate? Yes  acetowhite lesion(s) noted at 12 and 6  o'clock; corresponding biopsies obtained.  ECC specimen obtained. All specimens were labelled and sent to pathology.   Patient was given post procedure instructions.  Will follow up pathology and manage accordingly.  Routine preventative health maintenance measures emphasized.    Nettie ElmMichael Kamiyah Kindel, MD, FACOG Attending Obstetrician & Gynecologist Center for [facility], [facility]

## 2018-04-05 NOTE — Patient Instructions (Signed)
Colposcopy, Care After  This sheet gives you information about how to care for yourself after your procedure. Your doctor may also give you more specific instructions. If you have problems or questions, contact your doctor.  What can I expect after the procedure?  If you did not have a tissue sample removed (did not have a biopsy), you may only have some spotting for a few days. You can go back to your normal activities.  If you had a tissue sample removed, it is common to have:  · Soreness and pain. This may last for a few days.  · Light-headedness.  · Mild bleeding from your vagina or dark-colored, grainy discharge from your vagina. This may last for a few days. You may need to wear a sanitary pad.  · Spotting for at least 48 hours after the procedure.    Follow these instructions at home:  · Take over-the-counter and prescription medicines only as told by your doctor. Ask your doctor what medicines you can start taking again. This is very important if you take blood-thinning medicine.  · Do not drive or use heavy machinery while taking prescription pain medicine.  · For 3 days, or as long as your doctor tells you, avoid:  ? Douching.  ? Using tampons.  ? Having sex.  · If you use birth control (contraception), keep using it.  · Limit activity for the first day after the procedure. Ask your doctor what activities are safe for you.  · It is up to you to get the results of your procedure. Ask your doctor when your results will be ready.  · Keep all follow-up visits as told by your doctor. This is important.  Contact a doctor if:  · You get a skin rash.  Get help right away if:  · You are bleeding a lot from your vagina. It is a lot of bleeding if you are using more than one pad an hour for 2 hours in a row.  · You have clumps of blood (blood clots) coming from your vagina.  · You have a fever.  · You have chills  · You have pain in your lower belly (pelvic area).  · You have signs of infection, such as vaginal  discharge that is:  ? Different than usual.  ? Yellow.  ? Bad-smelling.  · You have very pain or cramps in your lower belly that do not get better with medicine.  · You feel light-headed.  · You feel dizzy.  · You pass out (faint).  Summary  · If you did not have a tissue sample removed (did not have a biopsy), you may only have some spotting for a few days. You can go back to your normal activities.  · If you had a tissue sample removed, it is common to have mild pain and spotting for 48 hours.  · For 3 days, or as long as your doctor tells you, avoid douching, using tampons and having sex.  · Get help right away if you have bleeding, very bad pain, or signs of infection.  This information is not intended to replace advice given to you by your health care provider. Make sure you discuss any questions you have with your health care provider.  Document Released: 02/10/2008 Document Revised: 05/13/2016 Document Reviewed: 05/13/2016  Elsevier Interactive Patient Education © 2018 Elsevier Inc.

## 2018-04-06 ENCOUNTER — Telehealth: Payer: Self-pay | Admitting: *Deleted

## 2018-04-06 NOTE — Telephone Encounter (Signed)
Shivon called this am and left a voice message she had a procedure  Yesterday and she has some questions about the aftercare.

## 2018-04-08 ENCOUNTER — Telehealth: Payer: Self-pay | Admitting: *Deleted

## 2018-04-08 NOTE — Telephone Encounter (Signed)
I called Kristin Kline and gave her results of her colposcopy and reccommendation for CKC. I explained she will get a call or letter with appt for procedure in or or a call to be seen in office before CKC can be scheduled in or. She voices understanding.

## 2018-04-08 NOTE — Telephone Encounter (Signed)
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

## 2018-04-08 NOTE — Telephone Encounter (Signed)
Kristin Kline called front desk and asked for nurse to call her back. I called [name] and she had a question about procedure which I answered.

## 2018-04-08 NOTE — Telephone Encounter (Signed)
I called Kristin Kline and left a message I am returning her call. If You still have questions, please call our office Monday as we close today 12 noon- if you need to be seen urgently - go to MAU.

## 2018-04-11 DIAGNOSIS — Z0389 Encounter for observation for other suspected diseases and conditions ruled out: Secondary | ICD-10-CM | POA: Diagnosis not present

## 2018-04-11 DIAGNOSIS — Z3009 Encounter for other general counseling and advice on contraception: Secondary | ICD-10-CM | POA: Diagnosis not present

## 2018-04-11 DIAGNOSIS — Z1388 Encounter for screening for disorder due to exposure to contaminants: Secondary | ICD-10-CM | POA: Diagnosis not present

## 2018-04-13 ENCOUNTER — Telehealth: Payer: Self-pay

## 2018-04-13 NOTE — Telephone Encounter (Signed)
Pt calling with questions about upcoming procedure.

## 2018-04-15 ENCOUNTER — Telehealth (HOSPITAL_COMMUNITY): Payer: Self-pay | Admitting: *Deleted

## 2018-04-15 NOTE — Telephone Encounter (Signed)
Telephoned patient at home number and advised patient would need to meet to [facility]. Patient to meet Monday August 12 12:00.

## 2018-04-18 ENCOUNTER — Encounter (HOSPITAL_COMMUNITY): Payer: Self-pay

## 2018-04-18 NOTE — Telephone Encounter (Signed)
Per Dr.Ervin may make appt in office with him to discuss her questions.  I called Kristin Kline and notified her that registrars would call her with appt for prior to the procedure. She voices understanding.

## 2018-04-19 ENCOUNTER — Encounter (HOSPITAL_BASED_OUTPATIENT_CLINIC_OR_DEPARTMENT_OTHER): Payer: Self-pay | Admitting: *Deleted

## 2018-04-19 ENCOUNTER — Other Ambulatory Visit: Payer: Self-pay

## 2018-04-25 ENCOUNTER — Encounter (HOSPITAL_BASED_OUTPATIENT_CLINIC_OR_DEPARTMENT_OTHER)
Admission: RE | Admit: 2018-04-25 | Discharge: 2018-04-25 | Disposition: A | Discharge status: Home / Self Care | Payer: Self-pay | Source: Ambulatory Visit | Attending: Obstetrics and Gynecology | Admitting: Obstetrics and Gynecology

## 2018-04-25 ENCOUNTER — Ambulatory Visit (INDEPENDENT_AMBULATORY_CARE_PROVIDER_SITE_OTHER): Payer: Medicaid Other | Admitting: Obstetrics and Gynecology

## 2018-04-25 ENCOUNTER — Encounter: Payer: Self-pay | Admitting: Obstetrics and Gynecology

## 2018-04-25 VITALS — BP 115/70 | HR 61 | Ht 66.0 in | Wt 149.4 lb

## 2018-04-25 DIAGNOSIS — R87810 Cervical high risk human papillomavirus (HPV) DNA test positive: Secondary | ICD-10-CM | POA: Diagnosis not present

## 2018-04-25 DIAGNOSIS — R87610 Atypical squamous cells of undetermined significance on cytologic smear of cervix (ASC-US): Secondary | ICD-10-CM

## 2018-04-25 DIAGNOSIS — Z01812 Encounter for preprocedural laboratory examination: Secondary | ICD-10-CM | POA: Insufficient documentation

## 2018-04-25 DIAGNOSIS — F17210 Nicotine dependence, cigarettes, uncomplicated: Secondary | ICD-10-CM | POA: Diagnosis not present

## 2018-04-25 DIAGNOSIS — N871 Moderate cervical dysplasia: Secondary | ICD-10-CM | POA: Diagnosis not present

## 2018-04-25 LAB — CBC
HEMATOCRIT: 40.7 % (ref 36.0–46.0)
Hemoglobin: 13.7 g/dL (ref 12.0–15.0)
MCH: 32.2 pg (ref 26.0–34.0)
MCHC: 33.7 g/dL (ref 30.0–36.0)
MCV: 95.5 fL (ref 78.0–100.0)
PLATELETS: 256 10*3/uL (ref 150–400)
RBC: 4.26 MIL/uL (ref 3.87–5.11)
RDW: 11.8 % (ref 11.5–15.5)
WBC: 6.7 10*3/uL (ref 4.0–10.5)

## 2018-04-25 LAB — POCT PREGNANCY, URINE: Preg Test, Ur: NEGATIVE

## 2018-04-25 NOTE — Patient Instructions (Signed)
Cervical Conization Cervical conization (cone biopsy) is a procedure in which a cone-shaped portion of the cervix is cut out so that it can be examined under a microscope. The procedure is done to check for cancer cells or cells that might turn into cancer (precancerous cells). You may have this procedure if:  You have abnormal bleeding from your cervix.  You had an abnormal Pap test.  Something abnormal was seen on your cervix during an exam.  This procedure is performed in either a health care provider's office or in an operating room. Tell a health care provider about:  Any allergies you have.  All medicines you are taking, including vitamins, herbs, eye drops, creams, and over-the-counter medicines.  Any problems you or family members have had with the use of anesthetic medicines.  Any blood disorders you have.  Any surgeries you have had.  Any medical conditions you have.  Your smoking habits.  When you normally have your period.  Whether you are pregnant or may be pregnant. What are the risks? Generally, this is a safe procedure. However, problems may occur, including:  Heavy bleeding for several days or weeks after the procedure.  Allergic reactions to medicines or dyes.  Increased risk of preterm labor in future pregnancies.  Infection (rare).  Damage to the cervix or other structures or organs (rare).  What happens before the procedure? Staying hydrated Follow instructions from your health care provider about hydration, which may include:  Up to 2 hours before the procedure - you may continue to drink clear liquids, such as water, clear fruit juice, black coffee, and plain tea.  Eating and drinking restrictions Follow instructions from your health care provider about eating and drinking, which may include:  8 hours before the procedure - stop eating heavy meals or foods such as meat, fried foods, or fatty foods.  6 hours before the procedure - stop eating  light meals or foods, such as toast or cereal.  6 hours before the procedure - stop drinking milk or drinks that contain milk.  2 hours before the procedure - stop drinking clear liquids.  General instructions  Do not douche, have sex, use tampons, or use any vaginal medicines before the procedure as told by your health care provider.  You may be asked to empty your bladder and bowel right before the procedure.  Ask your health care provider about: ? Changing or stopping your normal medicines. This is important if you take diabetes medicines or blood thinners. ? Taking medicines such as aspirin and ibuprofen. These medicines can thin your blood. Do not take these medicines before your procedure if your doctor tells you not to.  Plan to have someone take you home from the hospital or clinic. What happens during the procedure?  To reduce your risk of infection: ? Your health care team will wash or sanitize their hands. ? Your skin will be washed with soap. ? Hair may be removed from the surgical area.  You will undress from the waist down and be given a gown to wear.  You will lie on an examining table and put your feet in stirrups.  An IV tube will be inserted into one of your veins.  You will be given one or more of the following: ? A medicine to help you relax (sedative). ? A medicine to numb the area (local anesthetic). ? A medicine to make you fall asleep (general anesthetic). ? A medicine that numbs the cervix (cervical block).    A lubricated device called a speculum will be inserted into your vagina. It will be used to spread open the walls of the vagina so your health care provider can see the inside of the vagina and cervix better.  An instrument that has a magnifying lens and a light (colposcope) will let your health care provider examine the cervix more closely.  Your health care provider will apply a solution to your cervix. This turns abnormal areas a pale  color.  A tissue sample will be removed from the cervix using one of the following methods: ? The cold knife method. In this method, the tissue is cut out with a knife (scalpel). ? The loop electrosurgical excision procedure (LEEP) method. In this method, the tissue is cut out with a thin wire that can burn (cauterize) the tissue with an electrical current. ? Laser treatment method. In this method, the tissue is cut out and then cauterized with a laser beam to prevent bleeding.  Your health care provider will apply a paste over the biopsy areas to help control bleeding.  The tissue sample will be examined under a microscope. The procedure may vary among health care providers and hospitals. What happens after the procedure?  Your blood pressure, heart rate, breathing rate, and blood oxygen level will be monitored often until the medicines you were given have worn off.  If you were given a local anesthetic, you will rest at the clinic or hospital until you are stable and feel ready to go home.  If you were given a general anesthetic, you may be monitored for a longer period of time.  You may have some cramping.  You may have bloody discharge or light to moderate bleeding.  You may have dark discharge coming from your vagina. This is from the paste used on the cervix to prevent bleeding. Summary  Cervical conization is a procedure in which a cone-shaped portion of the cervix is cut out so that it can be examined under a microscope.  The procedure is done to check for cancer cells or cells that might turn into cancer (precancerous cells). This information is not intended to replace advice given to you by your health care provider. Make sure you discuss any questions you have with your health care provider. Document Released: 06/03/2005 Document Revised: 08/26/2016 Document Reviewed: 08/26/2016 Elsevier Interactive Patient Education  2017 Elsevier Inc. Cervical Conization, Care  After This sheet gives you information about how to care for yourself after your procedure. Your doctor may also give you more specific instructions. If you have problems or questions, contact your doctor. Follow these instructions at home: Medicines  Take over-the-counter and prescription medicines only as told by your doctor.  Do not take aspirin until your doctor says it is okay.  If you take pain medicine: ? You may have constipation. To help treat this, your doctor may tell you to:  Drink enough fluid to keep your pee (urine) clear or pale yellow.  Take medicines.  Eat foods that are high in fiber. These include fresh fruits and vegetables, whole grains, bran, and beans.  Limit foods that are high in fat and sugar. These include fried foods and sweet foods. ? Do not drive or use heavy machines. General instructions  You can eat your usual diet unless your doctor tells you not to do so.  Take showers for the first week. Do not take baths, swim, or use hot tubs until your doctor says it is okay.  Do   not douche, use tampons, or have sex until your doctor says it is okay.  For 7-14 days after your procedure, avoid: ? Being very active. ? Exercising. ? Heavy lifting.  Keep all follow-up visits as told by your doctor. This is important. Contact a doctor if:  You have a rash.  You are dizzy or lightheaded.  You feel sick to your stomach (nauseous).  You throw up (vomit).  You have fluid from your vagina (vaginal discharge) that smells bad. Get help right away if:  There are blood clots coming from your vagina.  You have more bleeding than you would have in a normal period. For example, you soak a pad in less than 1 hour.  You have a fever.  You have more and more cramps.  You pass out (faint).  You have pain when peeing.  Your have a lot of pain.  Your pain gets worse.  Your pain does not get better when you take your medicine.  You have blood in your  pee.  You throw up (vomit). Summary  After your procedure, take over-the-counter and prescription medicines only as told by your doctor.  Do not douche, use tampons, or have sex until your doctor says it is okay.  For about 7-14 days after your procedure, try not to exercise or lift heavy objects.  Get help right away if you have new symptoms, or if your symptoms become worse. This information is not intended to replace advice given to you by your health care provider. Make sure you discuss any questions you have with your health care provider. Document Released: 06/02/2008 Document Revised: 08/26/2016 Document Reviewed: 08/26/2016 Elsevier Interactive Patient Education  2017 Elsevier Inc.  

## 2018-04-25 NOTE — Progress Notes (Signed)
Pt by for preoperative testing, UPT negative.

## 2018-04-25 NOTE — H&P (Signed)
Kristin Kline is an 27 y.o. female with Bx proven moderate/severe cervical dysplasia. CKC recommended for further treatment.    Menstrual History: Menarche age: [age] Patient's last menstrual period was 04/17/2018.    Past Medical History:  Diagnosis Date  . Bacterial vaginitis 01/20/2017  . Chlamydia   . Gonorrhea   . Trichomoniasis 01/20/2017    Past Surgical History:  Procedure Laterality Date  . WISDOM TOOTH EXTRACTION      Family History  Problem Relation Age of Onset  . Diabetes Mother     Social History:  reports that she has been smoking cigarettes. She has a 2.00 pack-year smoking history. She has never used smokeless tobacco. She reports that she drinks alcohol. She reports that she has current or past drug history. Drug: Marijuana. Frequency: 4.00 times per week.  Allergies: No Known Allergies  No medications prior to admission.    Review of Systems  Constitutional: Negative.   Respiratory: Negative.   Cardiovascular: Negative.   Genitourinary: Negative.     Height 5\' 6"  (1.676 m), weight 63.5 kg, last menstrual period 04/17/2018. Physical Exam  Constitutional: She appears well-developed and well-nourished.  Cardiovascular: Normal rate and regular rhythm.  Respiratory: Effort normal and breath sounds normal.  GI: Soft. Bowel sounds are normal.  Genitourinary:  Genitourinary Comments: Nl EGBUS Cervix healed Bx sites Uterus small mobile  No adnexal masses    Results for orders placed or performed during the hospital encounter of 04/27/18 (from the past 24 hour(s))  CBC     Status: None   Collection Time: 04/25/18 12:00 PM  Result Value Ref Range   WBC 6.7 4.0 - 10.5 K/uL   RBC 4.26 3.87 - 5.11 MIL/uL   Hemoglobin 13.7 12.0 - 15.0 g/dL   HCT 16.140.7 09.636.0 - 04.546.0 %   MCV 95.5 78.0 - 100.0 fL   MCH 32.2 26.0 - 34.0 pg   MCHC 33.7 30.0 - 36.0 g/dL   RDW 40.911.8 81.111.5 - 91.415.5 %   Platelets 256 150 - 400 K/uL    No results  found.  Assessment/Plan: Moderate/Severe cervical dysplasia  CKC R/B/Post op care reviewed.  Pt agrees to proceed with CKC.  Hermina StaggersMichael L Lova Urbieta 04/25/2018, 1:44 PM

## 2018-04-25 NOTE — Progress Notes (Signed)
Kristin Kline presents for pre op eval  Pt scheduled for CKC on Wednesday d/t mod/severe dysplasia on colpo Bx Bx results reviewed with pt. Procedure reviewed R/B/Post op care and potential cervical affects during future pregnancies. Pt verbalized understanding NPO, place and time for Wednesday reviewed  A/P Mod/severe cervical dysplasia CKC this Wednesday F/U with post op appt.

## 2018-04-27 ENCOUNTER — Other Ambulatory Visit: Payer: Self-pay

## 2018-04-27 ENCOUNTER — Ambulatory Visit (HOSPITAL_BASED_OUTPATIENT_CLINIC_OR_DEPARTMENT_OTHER): Payer: Medicaid Other | Admitting: Anesthesiology

## 2018-04-27 ENCOUNTER — Encounter (HOSPITAL_BASED_OUTPATIENT_CLINIC_OR_DEPARTMENT_OTHER)
Admission: RE | Disposition: A | Discharge status: Home / Self Care | Payer: Self-pay | Source: Ambulatory Visit | Attending: Obstetrics and Gynecology

## 2018-04-27 ENCOUNTER — Encounter (HOSPITAL_BASED_OUTPATIENT_CLINIC_OR_DEPARTMENT_OTHER): Payer: Self-pay | Admitting: Anesthesiology

## 2018-04-27 ENCOUNTER — Ambulatory Visit (HOSPITAL_BASED_OUTPATIENT_CLINIC_OR_DEPARTMENT_OTHER)
Admission: RE | Admit: 2018-04-27 | Discharge: 2018-04-27 | Disposition: A | Discharge status: Home / Self Care | Payer: Medicaid Other | Source: Ambulatory Visit | Attending: Obstetrics and Gynecology | Admitting: Obstetrics and Gynecology

## 2018-04-27 DIAGNOSIS — N871 Moderate cervical dysplasia: Secondary | ICD-10-CM | POA: Diagnosis not present

## 2018-04-27 DIAGNOSIS — D060 Carcinoma in situ of endocervix: Secondary | ICD-10-CM | POA: Diagnosis present

## 2018-04-27 DIAGNOSIS — N879 Dysplasia of cervix uteri, unspecified: Secondary | ICD-10-CM | POA: Diagnosis not present

## 2018-04-27 DIAGNOSIS — F17210 Nicotine dependence, cigarettes, uncomplicated: Secondary | ICD-10-CM | POA: Insufficient documentation

## 2018-04-27 HISTORY — PX: CERVICAL CONIZATION W/BX: SHX1330

## 2018-04-27 LAB — POCT PREGNANCY, URINE: PREG TEST UR: NEGATIVE

## 2018-04-27 SURGERY — CONE BIOPSY, CERVIX
Anesthesia: General | Site: Cervix

## 2018-04-27 MED ORDER — FERRIC SUBSULFATE 259 MG/GM EX SOLN
CUTANEOUS | Status: AC
  Filled 2018-04-27: qty 8

## 2018-04-27 MED ORDER — SOD CITRATE-CITRIC ACID 500-334 MG/5ML PO SOLN
ORAL | Status: AC
  Filled 2018-04-27: qty 30

## 2018-04-27 MED ORDER — FENTANYL CITRATE (PF) 100 MCG/2ML IJ SOLN: 25.0000 ug | INTRAMUSCULAR | Status: DC | PRN

## 2018-04-27 MED ORDER — PHENYLEPHRINE 40 MCG/ML (10ML) SYRINGE FOR IV PUSH (FOR BLOOD PRESSURE SUPPORT)
PREFILLED_SYRINGE | INTRAVENOUS | Status: AC
  Filled 2018-04-27: qty 10

## 2018-04-27 MED ORDER — IODINE STRONG (LUGOLS) 5 % PO SOLN
ORAL | Status: AC
  Filled 2018-04-27: qty 1

## 2018-04-27 MED ORDER — PROMETHAZINE HCL 25 MG/ML IJ SOLN: 6.2500 mg | INTRAMUSCULAR | Status: DC | PRN

## 2018-04-27 MED ORDER — ONDANSETRON HCL 4 MG/2ML IJ SOLN
INTRAMUSCULAR | Status: DC | PRN
  Administered 2018-04-27: 4 mg via INTRAVENOUS

## 2018-04-27 MED ORDER — PROPOFOL 500 MG/50ML IV EMUL
INTRAVENOUS | Status: AC
  Filled 2018-04-27: qty 50

## 2018-04-27 MED ORDER — FENTANYL CITRATE (PF) 100 MCG/2ML IJ SOLN
50.0000 ug | INTRAMUSCULAR | Status: DC | PRN
  Administered 2018-04-27: 100 ug via INTRAVENOUS

## 2018-04-27 MED ORDER — ROCURONIUM BROMIDE 10 MG/ML (PF) SYRINGE
PREFILLED_SYRINGE | INTRAVENOUS | Status: AC
Start: 2018-04-27 — End: ?
  Filled 2018-04-27: qty 10

## 2018-04-27 MED ORDER — MIDAZOLAM HCL 2 MG/2ML IJ SOLN
INTRAMUSCULAR | Status: AC
  Filled 2018-04-27: qty 2

## 2018-04-27 MED ORDER — LIDOCAINE 2% (20 MG/ML) 5 ML SYRINGE
INTRAMUSCULAR | Status: AC
Start: 2018-04-27 — End: ?
  Filled 2018-04-27: qty 5

## 2018-04-27 MED ORDER — SCOPOLAMINE 1 MG/3DAYS TD PT72: 1.0000 | MEDICATED_PATCH | Freq: Once | TRANSDERMAL | Status: DC | PRN

## 2018-04-27 MED ORDER — KETOROLAC TROMETHAMINE 30 MG/ML IJ SOLN
INTRAMUSCULAR | Status: DC | PRN
  Administered 2018-04-27: 30 mg via INTRAVENOUS

## 2018-04-27 MED ORDER — SOD CITRATE-CITRIC ACID 500-334 MG/5ML PO SOLN
30.0000 mL | ORAL | Status: AC
  Administered 2018-04-27: 30 mL via ORAL

## 2018-04-27 MED ORDER — EPHEDRINE 5 MG/ML INJ
INTRAVENOUS | Status: AC
  Filled 2018-04-27: qty 10

## 2018-04-27 MED ORDER — MIDAZOLAM HCL 2 MG/2ML IJ SOLN
1.0000 mg | INTRAMUSCULAR | Status: DC | PRN
  Administered 2018-04-27: 2 mg via INTRAVENOUS

## 2018-04-27 MED ORDER — DEXAMETHASONE SODIUM PHOSPHATE 4 MG/ML IJ SOLN
INTRAMUSCULAR | Status: DC | PRN
  Administered 2018-04-27: 10 mg via INTRAVENOUS

## 2018-04-27 MED ORDER — OXYCODONE HCL 5 MG PO TABS: 5.0000 mg | ORAL_TABLET | Freq: Four times a day (QID) | ORAL | 0 refills | Status: DC | PRN

## 2018-04-27 MED ORDER — FENTANYL CITRATE (PF) 100 MCG/2ML IJ SOLN
INTRAMUSCULAR | Status: AC
  Filled 2018-04-27: qty 2

## 2018-04-27 MED ORDER — LACTATED RINGERS IV SOLN
INTRAVENOUS | Status: DC
  Administered 2018-04-27: 10:00:00 via INTRAVENOUS

## 2018-04-27 MED ORDER — PROPOFOL 10 MG/ML IV BOLUS
INTRAVENOUS | Status: DC | PRN
  Administered 2018-04-27: 150 mg via INTRAVENOUS

## 2018-04-27 MED ORDER — FERRIC SUBSULFATE SOLN
Status: DC | PRN
  Administered 2018-04-27: 1 via TOPICAL

## 2018-04-27 MED ORDER — ONDANSETRON HCL 4 MG/2ML IJ SOLN
INTRAMUSCULAR | Status: AC
  Filled 2018-04-27: qty 2

## 2018-04-27 MED ORDER — LIDOCAINE 2% (20 MG/ML) 5 ML SYRINGE
INTRAMUSCULAR | Status: DC | PRN
  Administered 2018-04-27: 75 mg via INTRAVENOUS

## 2018-04-27 MED ORDER — IODINE STRONG (LUGOLS) 5 % PO SOLN
ORAL | Status: DC | PRN
  Administered 2018-04-27: 0.1 mL

## 2018-04-27 MED ORDER — KETOROLAC TROMETHAMINE 30 MG/ML IJ SOLN
INTRAMUSCULAR | Status: AC
  Filled 2018-04-27: qty 1

## 2018-04-27 MED ORDER — SUCCINYLCHOLINE CHLORIDE 200 MG/10ML IV SOSY
PREFILLED_SYRINGE | INTRAVENOUS | Status: AC
Start: 2018-04-27 — End: ?
  Filled 2018-04-27: qty 10

## 2018-04-27 MED ORDER — IBUPROFEN 800 MG PO TABS: 800.0000 mg | ORAL_TABLET | Freq: Three times a day (TID) | ORAL | 0 refills | Status: DC | PRN

## 2018-04-27 MED ORDER — DEXAMETHASONE SODIUM PHOSPHATE 10 MG/ML IJ SOLN
INTRAMUSCULAR | Status: AC
  Filled 2018-04-27: qty 1

## 2018-04-27 SURGICAL SUPPLY — 29 items
APPLICATOR COTTON TIP 6 STRL (MISCELLANEOUS) ×1 IMPLANT
APPLICATOR COTTON TIP 6IN STRL (MISCELLANEOUS) ×3
BLADE 11 SAFETY STRL DISP (BLADE) IMPLANT
BLADE BIOSPY CERVICAL (BLADE) ×3 IMPLANT
BRIEF STRETCH FOR OB PAD XXL (UNDERPADS AND DIAPERS) ×3 IMPLANT
CATH ROBINSON RED A/P 16FR (CATHETERS) IMPLANT
CLOTH BEACON ORANGE TIMEOUT ST (SAFETY) IMPLANT
COUNTER NEEDLE 1200 MAGNETIC (NEEDLE) IMPLANT
ELECT BALL LEEP 5MM RED (ELECTRODE) ×3 IMPLANT
ELECT REM PT RETURN 9FT ADLT (ELECTROSURGICAL) ×3
ELECTRODE REM PT RTRN 9FT ADLT (ELECTROSURGICAL) ×1 IMPLANT
GLOVE BIO SURGEON STRL SZ7.5 (GLOVE) ×3 IMPLANT
GLOVE BIOGEL PI IND STRL 7.0 (GLOVE) ×1 IMPLANT
GLOVE BIOGEL PI INDICATOR 7.0 (GLOVE) ×2
GOWN STRL REUS W/TWL LRG LVL3 (GOWN DISPOSABLE) IMPLANT
GOWN STRL REUS W/TWL XL LVL3 (GOWN DISPOSABLE) ×6 IMPLANT
HOSE NS SMOKE EVAC 7/8 X6 (MISCELLANEOUS) ×2 IMPLANT
HOSE NS SMOKE EVAC 7/8 X6' (MISCELLANEOUS) ×1
NS IRRIG 1000ML POUR BTL (IV SOLUTION) ×3 IMPLANT
PACK VAGINAL MINOR WOMEN LF (CUSTOM PROCEDURE TRAY) ×3 IMPLANT
PAD OB MATERNITY 4.3X12.25 (PERSONAL CARE ITEMS) ×3 IMPLANT
PAD PREP 24X48 CUFFED NSTRL (MISCELLANEOUS) ×3 IMPLANT
PENCIL BUTTON HOLSTER BLD 10FT (ELECTRODE) IMPLANT
REDUCER FITTING SMOKE EVAC (MISCELLANEOUS) IMPLANT
SCOPETTES 8  STERILE (MISCELLANEOUS) ×2
SCOPETTES 8 STERILE (MISCELLANEOUS) ×1 IMPLANT
SUT VIC AB PLUS 45CM 1-MO-4 (SUTURE) ×3 IMPLANT
TOWEL GREEN STERILE FF (TOWEL DISPOSABLE) ×3 IMPLANT
TUBING SMOKE EVAC HOSE ADAPTER (MISCELLANEOUS) IMPLANT

## 2018-04-27 NOTE — Discharge Instructions (Signed)
Cervical Conization, Care After °This sheet gives you information about how to care for yourself after your procedure. Your doctor may also give you more specific instructions. If you have problems or questions, contact your doctor. °Follow these instructions at home: °Medicines °· Take over-the-counter and prescription medicines only as told by your doctor. °· Do not take aspirin until your doctor says it is okay. °· If you take pain medicine: °? You may have constipation. To help treat this, your doctor may tell you to: °§ Drink enough fluid to keep your pee (urine) clear or pale yellow. °§ Take medicines. °§ Eat foods that are high in fiber. These include fresh fruits and vegetables, whole grains, bran, and beans. °§ Limit foods that are high in fat and sugar. These include fried foods and sweet foods. °? Do not drive or use heavy machines. °General instructions °· You can eat your usual diet unless your doctor tells you not to do so. °· Take showers for the first week. Do not take baths, swim, or use hot tubs until your doctor says it is okay. °· Do not douche, use tampons, or have sex until your doctor says it is okay. °· For 7-14 days after your procedure, avoid: °? Being very active. °? Exercising. °? Heavy lifting. °· Keep all follow-up visits as told by your doctor. This is important. °Contact a doctor if: °· You have a rash. °· You are dizzy or lightheaded. °· You feel sick to your stomach (nauseous). °· You throw up (vomit). °· You have fluid from your vagina (vaginal discharge) that smells bad. °Get help right away if: °· There are blood clots coming from your vagina. °· You have more bleeding than you would have in a normal period. For example, you soak a pad in less than 1 hour. °· You have a fever. °· You have more and more cramps. °· You pass out (faint). °· You have pain when peeing. °· Your have a lot of pain. °· Your pain gets worse. °· Your pain does not get better when you take your  medicine. °· You have blood in your pee. °· You throw up (vomit). °Summary °· After your procedure, take over-the-counter and prescription medicines only as told by your doctor. °· Do not douche, use tampons, or have sex until your doctor says it is okay. °· For about 7-14 days after your procedure, try not to exercise or lift heavy objects. °· Get help right away if you have new symptoms, or if your symptoms become worse. °This information is not intended to replace advice given to you by your health care provider. Make sure you discuss any questions you have with your health care provider. °Document Released: 06/02/2008 Document Revised: 08/26/2016 Document Reviewed: 08/26/2016 °Elsevier Interactive Patient Education © 2017 [employer] ° ° °Post Anesthesia Home Care Instructions ° °Activity: °Get plenty of rest for the remainder of the day. A responsible individual must stay with you for 24 hours following the procedure.  °For the next 24 hours, DO NOT: °-Drive a car °-Operate machinery °-Drink alcoholic beverages °-Take any medication unless instructed by your physician °-Make any legal decisions or sign important papers. ° °Meals: °Start with liquid foods such as gelatin or soup. Progress to regular foods as tolerated. Avoid greasy, spicy, heavy foods. If nausea and/or vomiting occur, drink only clear liquids until the nausea and/or vomiting subsides. Call your physician if vomiting continues. ° °Special Instructions/Symptoms: °Your throat may feel dry or sore from   the anesthesia or the breathing tube placed in your throat during surgery. If this causes discomfort, gargle with warm salt water. The discomfort should disappear within 24 hours. ° °If you had a scopolamine patch placed behind your ear for the management of post- operative nausea and/or vomiting: ° °1. The medication in the patch is effective for 72 hours, after which it should be removed.  Wrap patch in a tissue and discard in the trash. Wash  hands thoroughly with soap and water. °2. You may remove the patch earlier than 72 hours if you experience unpleasant side effects which may include dry mouth, dizziness or visual disturbances. °3. Avoid touching the patch. Wash your hands with soap and water after contact with the patch. °  ° ° °

## 2018-04-27 NOTE — Anesthesia Postprocedure Evaluation (Signed)
Anesthesia Post Note  Patient: Perry S Troia  Procedure(s) Performed: CONIZATION CERVIX WITH BIOPSY - COLD KNIFE AND ENDOCERVICAL CURETTAGE (N/A Cervix)     Patient location during evaluation: PACU Anesthesia Type: General Level of consciousness: awake and alert, oriented and awake Pain management: pain level controlled Vital Signs Assessment: post-procedure vital signs reviewed and stable Respiratory status: spontaneous breathing, nonlabored ventilation and respiratory function stable Cardiovascular status: blood pressure returned to baseline and stable Postop Assessment: no apparent nausea or vomiting Anesthetic complications: no    Last Vitals:  Vitals:   04/27/18 1130 04/27/18 1150  BP: 123/77 121/75  Pulse: 74 77  Resp: 12 16  Temp:  36.4 C  SpO2: 100% 100%    Last Pain:  Vitals:   04/27/18 1150  TempSrc: Oral  PainSc: 3                  Cecile HearingStephen Edward Turk

## 2018-04-27 NOTE — Transfer of Care (Signed)
Immediate Anesthesia Transfer of Care Note  Patient: Kristin Kline  Procedure(s) Performed: CONIZATION CERVIX WITH BIOPSY - COLD KNIFE (N/A Cervix)  Patient Location: PACU  Anesthesia Type:General  Level of Consciousness: sedated  Airway & Oxygen Therapy: Patient Spontanous Breathing and Patient connected to face mask oxygen  Post-op Assessment: Report given to RN and Post -op Vital signs reviewed and stable  Post vital signs: Reviewed and stable  Last Vitals:  Vitals Value Taken Time  BP 111/70 04/27/2018 11:10 AM  Temp    Pulse 73 04/27/2018 11:12 AM  Resp 20 04/27/2018 11:12 AM  SpO2 100 % 04/27/2018 11:12 AM  Vitals shown include unvalidated device data.  Last Pain:  Vitals:   04/27/18 0940  TempSrc: Oral  PainSc: 0-No pain      Patients Stated Pain Goal: 0 (04/19/18 1555)  Complications: No apparent anesthesia complications

## 2018-04-27 NOTE — Anesthesia Preprocedure Evaluation (Addendum)
Anesthesia Evaluation  Patient identified by MRN, date of birth, ID band Patient awake    Reviewed: Allergy & Precautions, NPO status , Patient's Chart, lab work & pertinent test results  Airway Mallampati: II  TM Distance: >3 FB Neck ROM: Full    Dental  (+) Teeth Intact, Dental Advisory Given   Pulmonary Current Smoker,    Pulmonary exam normal breath sounds clear to auscultation       Cardiovascular negative cardio ROS Normal cardiovascular exam Rhythm:Regular Rate:Normal     Neuro/Psych negative neurological ROS  negative psych ROS   GI/Hepatic negative GI ROS, Neg liver ROS,   Endo/Other  negative endocrine ROS  Renal/GU negative Renal ROS     Musculoskeletal negative musculoskeletal ROS (+)   Abdominal   Peds  Hematology negative hematology ROS (+)   Anesthesia Other Findings Day of surgery medications reviewed with the patient.  Reproductive/Obstetrics                            Anesthesia Physical Anesthesia Plan  ASA: II  Anesthesia Plan: General   Post-op Pain Management:    Induction: Intravenous  PONV Risk Score and Plan: 3 and Midazolam, Dexamethasone and Ondansetron  Airway Management Planned: LMA  Additional Equipment:   Intra-op Plan:   Post-operative Plan: Extubation in OR  Informed Consent: I have reviewed the patients History and Physical, chart, labs and discussed the procedure including the risks, benefits and alternatives for the proposed anesthesia with the patient or authorized representative who has indicated his/her understanding and acceptance.   Dental advisory given  Plan Discussed with: CRNA  Anesthesia Plan Comments:         Anesthesia Quick Evaluation

## 2018-04-27 NOTE — Op Note (Signed)
Preoperative diagnosis: Moderate/Severe cervical dysplasia  Postoperative diagnosis: Same  Procedure: Cervical conization  Surgeon: Nettie ElmMichael Beatriz Settles M.D.  Anesthesia:  Cecile Hearing- Turk, Stephen Edward, MD  Findings: Abnormal uptake of Lugol's at 6 o'clock  Estimated blood loss: 15 cc  Specimens: Cervical conization and ECC  Reason for procedure: [name] [id number] with h/o moderate to severe cervical dysplasia proven by Bx.   Procedure: Patient was taken to the operating room where  analgesia was administered.She was prepped and draped in the usual sterile fashion. A timeout was performed. The patient had SCDs in place. The patient was in dorsal lithotomy.  A weighted speculum was placed inside the vagina.  A Deaver was used anteriorly. The cervix was grasped with a single tooth tenaculum.  A 0 Vicryl suture on a CT-1 was used to put stay sutures in cervix from 10-8 and from 2-4 o'clock. . An Beaver blade was used to make stab incisions at 12,3,6 and 9 o'clock. The incisions were connected and specimen was then removed with Mayo scissors. ECC was obtained. Specimens were passed off for pathology.  The electrocautery ball was used over the conization site.   Monsel's solution was used for hemostasis. The conization site was closed in a Stoudoff fashion with 0 Vicryl.  Hemostasis was noted. Cervical patency was verified with easy passage of a uterine sound. All instrument, needle and lap counts were correct x 2. The patient was taken to recovery in stable condition.  Kristin Galeno L. [name], MD

## 2018-04-27 NOTE — Anesthesia Procedure Notes (Signed)
Procedure Name: LMA Insertion Date/Time: 04/27/2018 10:36 AM Performed by: Ronnette HilaPayne, Naomi Fitton D, CRNA Pre-anesthesia Checklist: Patient identified, Emergency Drugs available, Suction available and Patient being monitored Patient Re-evaluated:Patient Re-evaluated prior to induction Oxygen Delivery Method: Circle system utilized Preoxygenation: Pre-oxygenation with 100% oxygen Induction Type: IV induction Ventilation: Mask ventilation without difficulty LMA: LMA inserted LMA Size: 4.0 Number of attempts: 1 Airway Equipment and Method: Bite block Placement Confirmation: positive ETCO2 Tube secured with: Tape Dental Injury: Teeth and Oropharynx as per pre-operative assessment

## 2018-04-27 NOTE — Interval H&P Note (Signed)
History and Physical Interval Note:  04/27/2018 10:28 AM  Kristin Kline  has presented today for surgery, with the diagnosis of CERVICAL DYSPLSIA  The various methods of treatment have been discussed with the patient and family. After consideration of risks, benefits and other options for treatment, the patient has consented to  Procedure(s): CONIZATION CERVIX WITH BIOPSY - COLD KNIFE (N/A) as a surgical intervention .  The patient's history has been reviewed, patient examined, no change in status, stable for surgery.  I have reviewed the patient's chart and labs.  Questions were answered to the patient's satisfaction.     Hermina StaggersMichael L Lesle Faron

## 2018-04-28 ENCOUNTER — Encounter (HOSPITAL_BASED_OUTPATIENT_CLINIC_OR_DEPARTMENT_OTHER): Payer: Self-pay | Admitting: Obstetrics and Gynecology

## 2018-05-02 ENCOUNTER — Telehealth: Payer: Self-pay | Admitting: *Deleted

## 2018-05-02 NOTE — Telephone Encounter (Signed)
Pt left message stating that she had surgery last week and would like her lab results.

## 2018-05-03 NOTE — Telephone Encounter (Signed)
Patient called office again today requesting results. Discussed results of Cone biopsy with patient including negative margins. Patient states she was told to follow-up in 4 weeks for post operative appointment. In-basket message sent to Admin pool to schedule post op with Dr. [name] and call patient with appointment. Patient advised she will receive a call with that appointment. Patient advised of importance of that appointment to ensure proper healing from surgery as well as to go over follow-up plan for next pap smear etc... Patient voiced understanding.   Marny LowensteinWenzel, Julie N, PA-C 05/03/2018 10:45 AM

## 2018-05-18 ENCOUNTER — Ambulatory Visit (INDEPENDENT_AMBULATORY_CARE_PROVIDER_SITE_OTHER): Payer: Self-pay | Admitting: Obstetrics and Gynecology

## 2018-05-18 ENCOUNTER — Encounter: Payer: Self-pay | Admitting: Obstetrics and Gynecology

## 2018-05-18 DIAGNOSIS — Z9889 Other specified postprocedural states: Secondary | ICD-10-CM

## 2018-05-18 DIAGNOSIS — Z87410 Personal history of cervical dysplasia: Secondary | ICD-10-CM

## 2018-05-18 NOTE — Progress Notes (Signed)
Kristin Kline is here for follow from [facility] on 04/27/18 d/t mod/severe dysplasia. She has no complaints. No bowel or bladder dysfunction Has had a cycle since surgery. Pathology reviewed with pt.  PE AF VSS Lungs clear Heart RRR Abd soft + BS GU healing CKC site  A/P Post op  Return to normal ADL's. Repeat pap smear in 3 months.

## 2018-05-25 ENCOUNTER — Encounter (HOSPITAL_COMMUNITY): Payer: Self-pay | Admitting: *Deleted

## 2018-05-27 ENCOUNTER — Telehealth: Payer: Self-pay | Admitting: General Practice

## 2018-05-27 NOTE — Telephone Encounter (Signed)
Patient called & left message on nurse voicemail line stating she has a serious question for a nurse and would like a call back. Called patient and she states she saw the doctor last week for her post op after her surgery and was told to wait two more weeks before having intercourse, but she didn't. Patient states she is worried something will happen. Patient does not report pain or bleeding at this time. Told patient she should be okay but to look out for abnormal bleeding, sudden abdominal pain, abnormal discharge or fever or chills. Patient verbalized understanding & had no questions.

## 2018-06-20 DIAGNOSIS — Z1388 Encounter for screening for disorder due to exposure to contaminants: Secondary | ICD-10-CM | POA: Diagnosis not present

## 2018-06-20 DIAGNOSIS — Z0389 Encounter for observation for other suspected diseases and conditions ruled out: Secondary | ICD-10-CM | POA: Diagnosis not present

## 2018-06-20 DIAGNOSIS — Z3009 Encounter for other general counseling and advice on contraception: Secondary | ICD-10-CM | POA: Diagnosis not present

## 2018-08-02 ENCOUNTER — Telehealth: Payer: Self-pay | Admitting: Family Medicine

## 2018-08-02 NOTE — Telephone Encounter (Signed)
LVM for appointment on 09/14/2018 @ 10:55 w/ [name]. Left office number to call if need to reschedule.

## 2018-09-14 ENCOUNTER — Ambulatory Visit (INDEPENDENT_AMBULATORY_CARE_PROVIDER_SITE_OTHER): Payer: Medicaid Other | Admitting: Obstetrics and Gynecology

## 2018-09-14 ENCOUNTER — Other Ambulatory Visit (HOSPITAL_COMMUNITY)
Admission: RE | Admit: 2018-09-14 | Discharge: 2018-09-14 | Disposition: A | Discharge status: Home / Self Care | Payer: Medicaid Other | Source: Ambulatory Visit | Attending: Obstetrics and Gynecology | Admitting: Obstetrics and Gynecology

## 2018-09-14 ENCOUNTER — Encounter: Payer: Self-pay | Admitting: Obstetrics and Gynecology

## 2018-09-14 VITALS — BP 122/81 | HR 64 | Ht 66.0 in | Wt 153.5 lb

## 2018-09-14 DIAGNOSIS — Z01419 Encounter for gynecological examination (general) (routine) without abnormal findings: Secondary | ICD-10-CM | POA: Diagnosis not present

## 2018-09-14 DIAGNOSIS — Z87410 Personal history of cervical dysplasia: Secondary | ICD-10-CM

## 2018-09-14 NOTE — Progress Notes (Signed)
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 F/U per results

## 2018-09-16 LAB — CYTOLOGY - PAP
Chlamydia: NEGATIVE
Diagnosis: NEGATIVE
Neisseria Gonorrhea: NEGATIVE

## 2018-11-09 DIAGNOSIS — Z3009 Encounter for other general counseling and advice on contraception: Secondary | ICD-10-CM | POA: Diagnosis not present

## 2018-11-09 DIAGNOSIS — Z1388 Encounter for screening for disorder due to exposure to contaminants: Secondary | ICD-10-CM | POA: Diagnosis not present

## 2018-11-09 DIAGNOSIS — Z0389 Encounter for observation for other suspected diseases and conditions ruled out: Secondary | ICD-10-CM | POA: Diagnosis not present

## 2019-03-12 ENCOUNTER — Encounter (HOSPITAL_COMMUNITY): Payer: Self-pay | Admitting: Emergency Medicine

## 2019-03-12 ENCOUNTER — Emergency Department (HOSPITAL_COMMUNITY): Payer: Medicaid Other

## 2019-03-12 ENCOUNTER — Emergency Department (HOSPITAL_COMMUNITY)
Admission: EM | Admit: 2019-03-12 | Discharge: 2019-03-12 | Disposition: A | Discharge status: Home / Self Care | Payer: Medicaid Other | Attending: Emergency Medicine | Admitting: Emergency Medicine

## 2019-03-12 ENCOUNTER — Other Ambulatory Visit: Payer: Self-pay

## 2019-03-12 DIAGNOSIS — F17210 Nicotine dependence, cigarettes, uncomplicated: Secondary | ICD-10-CM | POA: Diagnosis not present

## 2019-03-12 DIAGNOSIS — Z20828 Contact with and (suspected) exposure to other viral communicable diseases: Secondary | ICD-10-CM | POA: Diagnosis not present

## 2019-03-12 DIAGNOSIS — B349 Viral infection, unspecified: Secondary | ICD-10-CM | POA: Insufficient documentation

## 2019-03-12 DIAGNOSIS — R1030 Lower abdominal pain, unspecified: Secondary | ICD-10-CM | POA: Diagnosis not present

## 2019-03-12 DIAGNOSIS — J029 Acute pharyngitis, unspecified: Secondary | ICD-10-CM

## 2019-03-12 DIAGNOSIS — R197 Diarrhea, unspecified: Secondary | ICD-10-CM | POA: Insufficient documentation

## 2019-03-12 DIAGNOSIS — J028 Acute pharyngitis due to other specified organisms: Secondary | ICD-10-CM | POA: Diagnosis not present

## 2019-03-12 DIAGNOSIS — R112 Nausea with vomiting, unspecified: Secondary | ICD-10-CM | POA: Diagnosis not present

## 2019-03-12 DIAGNOSIS — R05 Cough: Secondary | ICD-10-CM | POA: Diagnosis not present

## 2019-03-12 DIAGNOSIS — Z20822 Contact with and (suspected) exposure to covid-19: Secondary | ICD-10-CM

## 2019-03-12 LAB — URINALYSIS, ROUTINE W REFLEX MICROSCOPIC
Bilirubin Urine: NEGATIVE
Glucose, UA: NEGATIVE mg/dL
Hgb urine dipstick: NEGATIVE
Ketones, ur: NEGATIVE mg/dL
Leukocytes,Ua: NEGATIVE
Nitrite: NEGATIVE
Protein, ur: NEGATIVE mg/dL
Specific Gravity, Urine: 1.029 (ref 1.005–1.030)
pH: 5 (ref 5.0–8.0)

## 2019-03-12 LAB — CBC WITH DIFFERENTIAL/PLATELET
Abs Immature Granulocytes: 0.04 10*3/uL (ref 0.00–0.07)
Basophils Absolute: 0 10*3/uL (ref 0.0–0.1)
Basophils Relative: 0 %
Eosinophils Absolute: 0 10*3/uL (ref 0.0–0.5)
Eosinophils Relative: 0 %
HCT: 40.7 % (ref 36.0–46.0)
Hemoglobin: 13.9 g/dL (ref 12.0–15.0)
Immature Granulocytes: 0 %
Lymphocytes Relative: 5 %
Lymphs Abs: 0.6 10*3/uL — ABNORMAL LOW (ref 0.7–4.0)
MCH: 32.5 pg (ref 26.0–34.0)
MCHC: 34.2 g/dL (ref 30.0–36.0)
MCV: 95.1 fL (ref 80.0–100.0)
Monocytes Absolute: 0.4 10*3/uL (ref 0.1–1.0)
Monocytes Relative: 4 %
Neutro Abs: 9.8 10*3/uL — ABNORMAL HIGH (ref 1.7–7.7)
Neutrophils Relative %: 91 %
Platelets: 222 10*3/uL (ref 150–400)
RBC: 4.28 MIL/uL (ref 3.87–5.11)
RDW: 12 % (ref 11.5–15.5)
WBC: 10.9 10*3/uL — ABNORMAL HIGH (ref 4.0–10.5)
nRBC: 0 % (ref 0.0–0.2)

## 2019-03-12 LAB — COMPREHENSIVE METABOLIC PANEL
ALT: 21 U/L (ref 0–44)
AST: 18 U/L (ref 15–41)
Albumin: 3.6 g/dL (ref 3.5–5.0)
Alkaline Phosphatase: 52 U/L (ref 38–126)
Anion gap: 9 (ref 5–15)
BUN: 10 mg/dL (ref 6–20)
CO2: 21 mmol/L — ABNORMAL LOW (ref 22–32)
Calcium: 8.7 mg/dL — ABNORMAL LOW (ref 8.9–10.3)
Chloride: 106 mmol/L (ref 98–111)
Creatinine, Ser: 0.98 mg/dL (ref 0.44–1.00)
GFR calc Af Amer: >60 mL/min (ref >60)
GFR calc non Af Amer: >60 mL/min (ref >60)
Glucose, Bld: 140 mg/dL — ABNORMAL HIGH (ref 70–99)
Potassium: 3.7 mmol/L (ref 3.5–5.1)
Sodium: 136 mmol/L (ref 135–145)
Total Bilirubin: 1 mg/dL (ref 0.3–1.2)
Total Protein: 6.6 g/dL (ref 6.5–8.1)

## 2019-03-12 LAB — I-STAT BETA HCG BLOOD, ED (MC, WL, AP ONLY): I-stat hCG, quantitative: <5 m[IU]/mL (ref <5)

## 2019-03-12 LAB — GROUP A STREP BY PCR: Group A Strep by PCR: NOT DETECTED

## 2019-03-12 LAB — LIPASE, BLOOD: Lipase: 22 U/L (ref 11–51)

## 2019-03-12 MED ORDER — NAPROXEN 500 MG PO TABS: 500.0000 mg | ORAL_TABLET | Freq: Two times a day (BID) | ORAL | 0 refills | Status: DC

## 2019-03-12 MED ORDER — ONDANSETRON 4 MG PO TBDP
4.0000 mg | ORAL_TABLET | Freq: Once | ORAL | Status: AC
  Administered 2019-03-12: 4 mg via ORAL
  Filled 2019-03-12: qty 1

## 2019-03-12 MED ORDER — ACETAMINOPHEN 500 MG PO TABS
1000.0000 mg | ORAL_TABLET | Freq: Once | ORAL | Status: AC
  Administered 2019-03-12: 1000 mg via ORAL
  Filled 2019-03-12: qty 2

## 2019-03-12 MED ORDER — NAPROXEN 250 MG PO TABS
500.0000 mg | ORAL_TABLET | Freq: Once | ORAL | Status: AC
  Administered 2019-03-12: 500 mg via ORAL
  Filled 2019-03-12: qty 2

## 2019-03-12 MED ORDER — ONDANSETRON 4 MG PO TBDP: 4.0000 mg | ORAL_TABLET | Freq: Three times a day (TID) | ORAL | 0 refills | Status: DC | PRN

## 2019-03-12 MED ORDER — DEXAMETHASONE SODIUM PHOSPHATE 10 MG/ML IJ SOLN
10.0000 mg | Freq: Once | INTRAMUSCULAR | Status: AC
  Administered 2019-03-12: 10 mg via INTRAMUSCULAR
  Filled 2019-03-12: qty 1

## 2019-03-12 NOTE — ED Triage Notes (Addendum)
Pt reports sore throat that started one week ago that has gotten worse. Reports difficulty swallowing and dry cough. Left sided tonsillar imflammation Pt reports this feels similar to when she had strep in the past. Pt then reports having mid abd pain that started 2-3 days ago with N/V/D. Pt reports LMP June 14th.

## 2019-03-12 NOTE — ED Notes (Signed)
X-ray at bedside

## 2019-03-12 NOTE — ED Provider Notes (Signed)
[facility] Provider Note   CSN: [id number] Arrival date & time: 03/12/19  1641    History   Chief Complaint Chief Complaint  Patient presents with  . Sore Throat  . Abdominal Pain    HPI Kristin Kline is a 28 y.o. female presents to the ER for evaluation of sore throat.  Described as constant, persistent, worsening.  Onset on 7 days ago but acutely worsening on Wednesday.  She has taken home remedies including hot water, tea and lemon with only mild, temporary relief.  A few days after the sore throat began she started having intermittent, sharp periumbilical abdominal pain that intermittently radiates down to her suprapubic abdomen associated with nausea, nonbloody nonbilious emesis and loose diarrhea without blood or melena, intermittent mild headaches, dry cough with associated central sharp chest pain with coughing and active vomiting, fatigue, back pain and myalgias.  No sick contacts at home.  She lives with her younger child and parents but no one has symptoms at home.  She is not going to work but admits that over the last week she has been starting to go out with friends to socialize.  No direct exposure to anybody sick or COVID.  No recent travel.    She denies any fever, congestion, rhinorrhea, exertional chest pain or shortness of breath, dysuria, hematuria.  She has had some cervical procedures by OB/GYN but denies any other abdominal surgeries.   Kristin Kline was evaluated in Emergency Department on 03/13/2019 for the symptoms described in the history of present illness. She was evaluated in the context of the global COVID-19 pandemic, which necessitated consideration that the patient might be at risk for infection with the SARS-CoV-2 virus that causes COVID-19. Institutional protocols and algorithms that pertain to the evaluation of patients at risk for COVID-19 are in a state of rapid change based on information released by  regulatory bodies including the CDC and federal and state organizations. These policies and algorithms were followed during the patient's care in the ED.   HPI  Past Medical History:  Diagnosis Date  . Bacterial vaginitis 01/20/2017  . Chlamydia   . Gonorrhea   . Trichomoniasis 01/20/2017    Patient Active Problem List   Diagnosis Date Noted  . Post-operative state 05/18/2018  . History of cervical dysplasia 05/18/2018    Past Surgical History:  Procedure Laterality Date  . CERVICAL CONIZATION W/BX N/A 04/27/2018   Procedure: CONIZATION CERVIX WITH BIOPSY - COLD KNIFE AND ENDOCERVICAL CURETTAGE;  Surgeon: Hermina StaggersErvin, Michael L, MD;  Location:  SURGERY CENTER;  Service: Gynecology;  Laterality: N/A;  . WISDOM TOOTH EXTRACTION       OB History    Gravida  1   Para  1   Term  1   Preterm      AB      Living  1     SAB      TAB      Ectopic      Multiple      Live Births  1            Home Medications    Prior to Admission medications   Medication Sig Start Date End Date Taking? Authorizing Provider  naproxen (NAPROSYN) 500 MG tablet Take 1 tablet (500 mg total) by mouth 2 (two) times daily. 03/12/19   Liberty HandyGibbons, Goddess Gebbia J, PA-C  ondansetron (ZOFRAN ODT) 4 MG disintegrating tablet Take 1 tablet (4 mg total) by mouth  every 8 (eight) hours as needed for nausea or vomiting. 03/12/19   Kinnie Feil, PA-C    Family History Family History  Problem Relation Age of Onset  . Diabetes Mother     Social History Social History   Tobacco Use  . Smoking status: Current Every Day Smoker    Packs/day: 0.25    Years: 8.00    Pack years: 2.00    Types: Cigarettes  . Smokeless tobacco: Never Used  Substance Use Topics  . Alcohol use: Yes    Comment: social  . Drug use: Yes    Frequency: 4.0 times per week    Types: Marijuana     Allergies   Patient has no known allergies.   Review of Systems Review of Systems  Constitutional: Positive for  fatigue.  HENT: Positive for sore throat.   Respiratory: Positive for cough.   Cardiovascular: Positive for chest pain (With coughing).  Gastrointestinal: Positive for abdominal pain, diarrhea, nausea and vomiting.  Musculoskeletal: Positive for back pain and myalgias.  Neurological: Positive for headaches.  All other systems reviewed and are negative.    Physical Exam Updated Vital Signs BP (!) 136/121   Pulse 91   Temp 98.4 F (36.9 C) (Oral)   Resp 16   Ht 5\' 6"  (1.676 m)   Wt 70.8 kg   LMP 02/19/2019   SpO2 100%   BMI 25.18 kg/m   Physical Exam Vitals signs and nursing note reviewed.  Constitutional:      Appearance: She is well-developed.     Comments: Non toxic in NAD  HENT:     Head: Normocephalic and atraumatic.     Nose: Nose normal.     Mouth/Throat:     Pharynx: Posterior oropharyngeal erythema present.     Tonsils: Tonsillar exudate present. 1+ on the right. 1+ on the left.     Comments: Erythema to the tonsils and oropharynx.  1+, symmetric tonsillar hypertrophy with exudates bilaterally.  No petechiae.  No vesicular lesions.  Soft palate is flat.  Normal tongue protrusion.  No sublingual fullness or tenderness.  No trismus.  Tolerating secretions. Eyes:     Conjunctiva/sclera: Conjunctivae normal.  Neck:     Musculoskeletal: Normal range of motion.     Comments: Mildly enlarged submandibular lymph nodes, symmetric, nontender. Cardiovascular:     Rate and Rhythm: Normal rate and regular rhythm.  Pulmonary:     Effort: Pulmonary effort is normal.     Breath sounds: Normal breath sounds.  Abdominal:     General: Bowel sounds are normal.     Palpations: Abdomen is soft.     Tenderness: There is no abdominal tenderness.     Comments: No G/R/R. No suprapubic or CVA tenderness. Negative Murphy's and McBurney's. Active BS to lower quadrants.   Musculoskeletal: Normal range of motion.  Skin:    General: Skin is warm and dry.     Capillary Refill: Capillary  refill takes less than 2 seconds.  Neurological:     Mental Status: She is alert.  Psychiatric:        Behavior: Behavior normal.      ED Treatments / Results  Labs (all labs ordered are listed, but only abnormal results are displayed) Labs Reviewed  COMPREHENSIVE METABOLIC PANEL - Abnormal; Notable for the following components:      Result Value   CO2 21 (*)    Glucose, Bld 140 (*)    Calcium 8.7 (*)    All  other components within normal limits  CBC WITH DIFFERENTIAL/PLATELET - Abnormal; Notable for the following components:   WBC 10.9 (*)    Neutro Abs 9.8 (*)    Lymphs Abs 0.6 (*)    All other components within normal limits  URINALYSIS, ROUTINE W REFLEX MICROSCOPIC - Abnormal; Notable for the following components:   APPearance HAZY (*)    All other components within normal limits  GROUP A STREP BY PCR  URINE CULTURE  NOVEL CORONAVIRUS, NAA (HOSPITAL ORDER, SEND-OUT TO REF LAB)  LIPASE, BLOOD  I-STAT BETA HCG BLOOD, ED ([facility], [facility], [facility] ONLY)    EKG None  Radiology Dg Chest Port 1 View  Result Date: 03/12/2019 CLINICAL DATA:  Dry cough. EXAM: PORTABLE CHEST 1 VIEW COMPARISON:  None. FINDINGS: The heart size and mediastinal contours are within normal limits. Both lungs are clear. The visualized skeletal structures are unremarkable. IMPRESSION: No active cardiopulmonary disease. Electronically Signed   By: Sherian ReinWei-Chen  Lin M.D.   On: 03/12/2019 19:13    Procedures Procedures (including critical care time)  Medications Ordered in ED Medications  dexamethasone (DECADRON) injection 10 mg (10 mg Intramuscular Given 03/12/19 2003)  naproxen (NAPROSYN) tablet 500 mg (500 mg Oral Given 03/12/19 1959)  acetaminophen (TYLENOL) tablet 1,000 mg (1,000 mg Oral Given 03/12/19 1959)  ondansetron (ZOFRAN-ODT) disintegrating tablet 4 mg (4 mg Oral Given 03/12/19 2000)     Initial Impression / Assessment and Plan / ED Course  I have reviewed the triage vital signs and the nursing notes.   Pertinent labs & imaging results that were available during my care of the patient were reviewed by me and considered in my medical decision making (see chart for details).  Clinical Course as of Mar 13 1115  Wynelle LinkSun Mar 12, 2019  [id number] [facility]  DG Chest [place] [CG]  [id number] Group A Strep by PCR: NOT DETECTED [CG]  1917 WBC(!): 10.9 [CG]    Clinical Course User Index [CG] Liberty HandyGibbons, Katheryn Culliton J, PA-C   I have reviewed patient's EMR to obtain pertinent PMH.   Symptoms and exam suggestive of viral illness. Ddx includes strep pharyngitis vs viral pharyngitis vs other URI including COVID-19.  No travel. No known exposures to confirmed COVID-19 but has been out with friends.   Exam reveals tonsillitis with exudates but no other signs of PTA, RPA or other deeper neck/throat infection to warrant imaging.  Normal WOB. No fever, tachypnea, tachycardia, hypoxemia. Lungs are CTAB. Abd benign.   Lab work, CXR, UA and strep reviewed and interpreted by me, and radiologist. All unremarkable.  No significant h/o immunocompromise. Doubt bacterial bronchitis or pneumonia.  Suspect viral tonsillitis. Centor score is low, will defer antibiotics and recommend supportive tx for sore throat. Decadron and NSAIDs given her with improvement in pain, tolerating PO. Suspect abdominal pain is part of viral illness. She has no focal tenderness, negative Murphy's McBurney's. No leukocytosis and acute intra abd process like cholecystitis, appy, SBO, diverticulitis considered less likely. UA w/o infection.  Given reassuring physical exam, will discharge with symptomatic treatment. Pt was evaluated in the context of the global COVID-19 pandemic.  We discussed her overall low risk profile to develop complications.   Pt understands signs and symptoms that would warrant return to ED.  Recommended PCP f/u in the next 2-3 days for persistent symptoms  for further guidance. Instructed self-isolation until COVID test finalized, pending at  discharge. Pt was given home self-isolation instructions and instructions for family members. Pt comfortable and agreeable  with POC.   Final Clinical Impressions(s) / ED Diagnoses   Final diagnoses:  Viral pharyngitis  Viral illness  Lower abdominal pain  Nausea vomiting and diarrhea  Suspected Covid-19 Virus Infection    ED Discharge Orders         Ordered    naproxen (NAPROSYN) 500 MG tablet  2 times daily     03/12/19 2101    ondansetron (ZOFRAN ODT) 4 MG disintegrating tablet  Every 8 hours PRN     03/12/19 2101           Liberty HandyGibbons, Parisha Beaulac J, PA-C 03/13/19 1116    Alvira MondaySchlossman, Erin, MD 03/17/19 2009

## 2019-03-12 NOTE — ED Notes (Signed)
Discharge instructions discussed with pt. Pt verbalized understanding. Pt stable and ambulatory. No signature pad available. 

## 2019-03-12 NOTE — Discharge Instructions (Signed)
You were seen in the ER for sore throat, cough, nausea, vomiting, abdominal pain, diarrhea, headaches.  Lab work today including urinalysis was normal.  Your strep test was negative.  Your chest x-ray was normal.  Given all your symptoms I suspect that there is an underlying viral illness.  Given that we are during the [place] pandemic, COVID is a possibility although it could be any other virus as well.  We tested your for COVID-19 (coronavirus) infection.  Test results come back in 48 hours, sometimes sooner.  Someone will call you to notify you of results.    Treatment of your illness and symptoms will include self-isolation, monitoring of symptoms and supportive care with over-the-counter medicines.    Return to the ED if there is increased work of breathing, shortness of breath, inability to tolerate fluids, weakness, chest pain.  If your test results are POSITIVE, the following isolation requirements need to be met to return to work and resume essential activities: At least 14 days since symptom onset  72 hours of absence of fever without antifever medicine (ibuprofen, acetaminophen). A fever is temperature of 100.25F or greater. Improvement of respiratory symptoms  If your test is NEGATIVE, you may return to work and essential activities as long as your symptoms have improved and you do not have a fever for a total of 3 days.  Call your job and notify them that your test result was negative to see if they will allow you to return to work.   Stay well-hydrated. Rest. You can use over the counter medications to help with symptoms: Alternate naproxen and acetaminophen. Take 500 mg naproxen every 12 hours and/or acetaminophen (tylenol) every 6 hours, around the clock to help with associated fevers, sore throat, headaches, generalized body aches and malaise.  Oxymetazoline (afrin) intranasal spray once daily for no more than 3 days to help with congestion, after 3 days you can switch to another  over-the-counter nasal steroid spray such as fluticasone (flonase) Allergy medication (loratadine, cetirizine, etc) and phenylephrine (sudafed) help with nasal congestion, runny nose and postnasal drip.   Dextromethorphan (Delsym) to suppress dry cough. Frequent coughing is likely causing your chest wall pain Zofran for nausea  Avoid irritating or heavy foods that will cause vomiting, diarrhea to worsen Wash your hands often to prevent spread.    Infection Prevention Recommendations for Individuals Confirmed to have, or Being Evaluated for, or have symptoms of 2019 Novel Coronavirus (COVID-19) Infection Who Receive Care at Home  Individuals who are confirmed to have, or are being evaluated for, COVID-19 should follow the prevention steps below until a healthcare provider or local or state health department says they can return to normal activities.  Stay home except to get medical care You should restrict activities outside your home, except for getting medical care. Do not go to work, school, or public areas, and do not use public transportation or taxis.  Call ahead before visiting your doctor Before your medical appointment, call the healthcare provider and tell them that you have, or are being evaluated for, COVID-19 infection. This will help the healthcare providers office take steps to keep other people from getting infected. Ask your healthcare provider to call the local or state health department.  Monitor your symptoms Seek prompt medical attention if your illness is worsening (e.g., difficulty breathing). Before going to your medical appointment, call the healthcare provider and tell them that you have, or are being evaluated for, COVID-19 infection. Ask your healthcare provider to call  the local or state health department.  Wear a facemask You should wear a facemask that covers your nose and mouth when you are in the same room with other people and when you visit a healthcare  provider. People who live with or visit you should also wear a facemask while they are in the same room with you.  Separate yourself from other people in your home As much as possible, you should stay in a different room from other people in your home. Also, you should use a separate bathroom, if available.  Avoid sharing household items You should not share dishes, drinking glasses, cups, eating utensils, towels, bedding, or other items with other people in your home. After using these items, you should wash them thoroughly with soap and water.  Cover your coughs and sneezes Cover your mouth and nose with a tissue when you cough or sneeze, or you can cough or sneeze into your sleeve. Throw used tissues in a lined trash can, and immediately wash your hands with soap and water for at least 20 seconds or use an alcohol-based hand rub.  Wash your [employer] [employer] your hands often and thoroughly with soap and water for at least 20 seconds. You can use an alcohol-based hand sanitizer if soap and water are not available and if your hands are not visibly dirty. Avoid touching your eyes, nose, and mouth with unwashed hands.   Prevention Steps for Caregivers and Household Members of Individuals Confirmed to have, or Being Evaluated for, or have symptoms of 2019 Novel Coronavirus (COVID-19) Infection Being Cared for in the Home  If you live with, or provide care at home for, a person confirmed to have, or being evaluated for, COVID-19 infection please follow these guidelines to prevent infection:  Follow healthcare providers instructions Make sure that you understand and can help the patient follow any healthcare provider instructions for all care.  Provide for the patients basic needs You should help the patient with basic needs in the home and provide support for getting groceries, prescriptions, and other personal needs.  Monitor the patients symptoms If they are getting sicker, call his or her  medical provider and tell them that the patient has, or is being evaluated for, COVID-19 infection. This will help the healthcare providers office take steps to keep other people from getting infected. Ask the healthcare provider to call the local or state health department.  Limit the number of people who have contact with the patient If possible, have only one caregiver for the patient. Other household members should stay in another home or place of residence. If this is not possible, they should stay in another room, or be separated from the patient as much as possible. Use a separate bathroom, if available. Restrict visitors who do not have an essential need to be in the home.  Keep older adults, very young children, and other sick people away from the patient Keep older adults, very young children, and those who have compromised immune systems or chronic health conditions away from the patient. This includes people with chronic heart, lung, or kidney conditions, diabetes, and cancer.  Ensure good ventilation Make sure that shared spaces in the home have good air flow, such as from an air conditioner or an opened window, weather permitting.  Wash your hands often Wash your hands often and thoroughly with soap and water for at least 20 seconds. You can use an alcohol based hand sanitizer if soap and water are not available  and if your hands are not visibly dirty. Avoid touching your eyes, nose, and mouth with unwashed hands. Use disposable paper towels to dry your hands. If not available, use dedicated cloth towels and replace them when they become wet.  Wear a facemask and gloves Wear a disposable facemask at all times in the room and gloves when you touch or have contact with the patients blood, body fluids, and/or secretions or excretions, such as sweat, saliva, sputum, nasal mucus, vomit, urine, or feces.  Ensure the mask fits over your nose and mouth tightly, and do not touch it during  use. Throw out disposable facemasks and gloves after using them. Do not reuse. Wash your hands immediately after removing your facemask and gloves. If your personal clothing becomes contaminated, carefully remove clothing and launder. Wash your hands after handling contaminated clothing. Place all used disposable facemasks, gloves, and other waste in a lined container before disposing them with other household waste. Remove gloves and wash your hands immediately after handling these items.  Do not share dishes, glasses, or other household items with the patient Avoid sharing household items. You should not share dishes, drinking glasses, cups, eating utensils, towels, bedding, or other items with a patient who is confirmed to have, or being evaluated for, COVID-19 infection. After the person uses these items, you should wash them thoroughly with soap and water.  Wash laundry thoroughly Immediately remove and wash clothes or bedding that have blood, body fluids, and/or secretions or excretions, such as sweat, saliva, sputum, nasal mucus, vomit, urine, or feces, on them. Wear gloves when handling laundry from the patient. Read and follow directions on labels of laundry or clothing items and detergent. In general, wash and dry with the warmest temperatures recommended on the label.  Clean all areas the individual has used often Clean all touchable surfaces, such as counters, tabletops, doorknobs, bathroom fixtures, toilets, phones, keyboards, tablets, and bedside tables, every day. Also, clean any surfaces that may have blood, body fluids, and/or secretions or excretions on them. Wear gloves when cleaning surfaces the patient has come in contact with. Use a diluted bleach solution (e.g., dilute bleach with 1 part bleach and 10 parts water) or a household disinfectant with a label that says EPA-registered for coronaviruses. To make a bleach solution at home, add 1 tablespoon of bleach to 1 quart (4  cups) of water. For a larger supply, add  cup of bleach to 1 gallon (16 cups) of water. Read labels of cleaning products and follow recommendations provided on product labels. Labels contain instructions for safe and effective use of the cleaning product including precautions you should take when applying the product, such as wearing gloves or eye protection and making sure you have good ventilation during use of the product. Remove gloves and wash hands immediately after cleaning.  Monitor yourself for signs and symptoms of illness Caregivers and household members are considered close contacts, should monitor their health, and will be asked to limit movement outside of the home to the extent possible. Follow the monitoring steps for close contacts listed on the symptom monitoring form.  ? If you have additional questions, contact your local health department or call the epidemiologist on call at [phone] (available 24/7). ? This guidance is subject to change. For the most up-to-date guidance from [facility], please refer to their website: YouBlogs.pl

## 2019-03-13 LAB — URINE CULTURE: Culture: <10000 — AB

## 2019-03-14 LAB — NOVEL CORONAVIRUS, NAA (HOSP ORDER, SEND-OUT TO REF LAB; TAT 18-24 HRS): SARS-CoV-2, NAA: NOT DETECTED

## 2019-08-03 IMAGING — DX PORTABLE CHEST - 1 VIEW
1 series · 1 of 1 positions shown · non-contrast
Comparison: None.

CLINICAL DATA: Dry cough.

EXAM:
PORTABLE CHEST 1 VIEW

[chest ap]
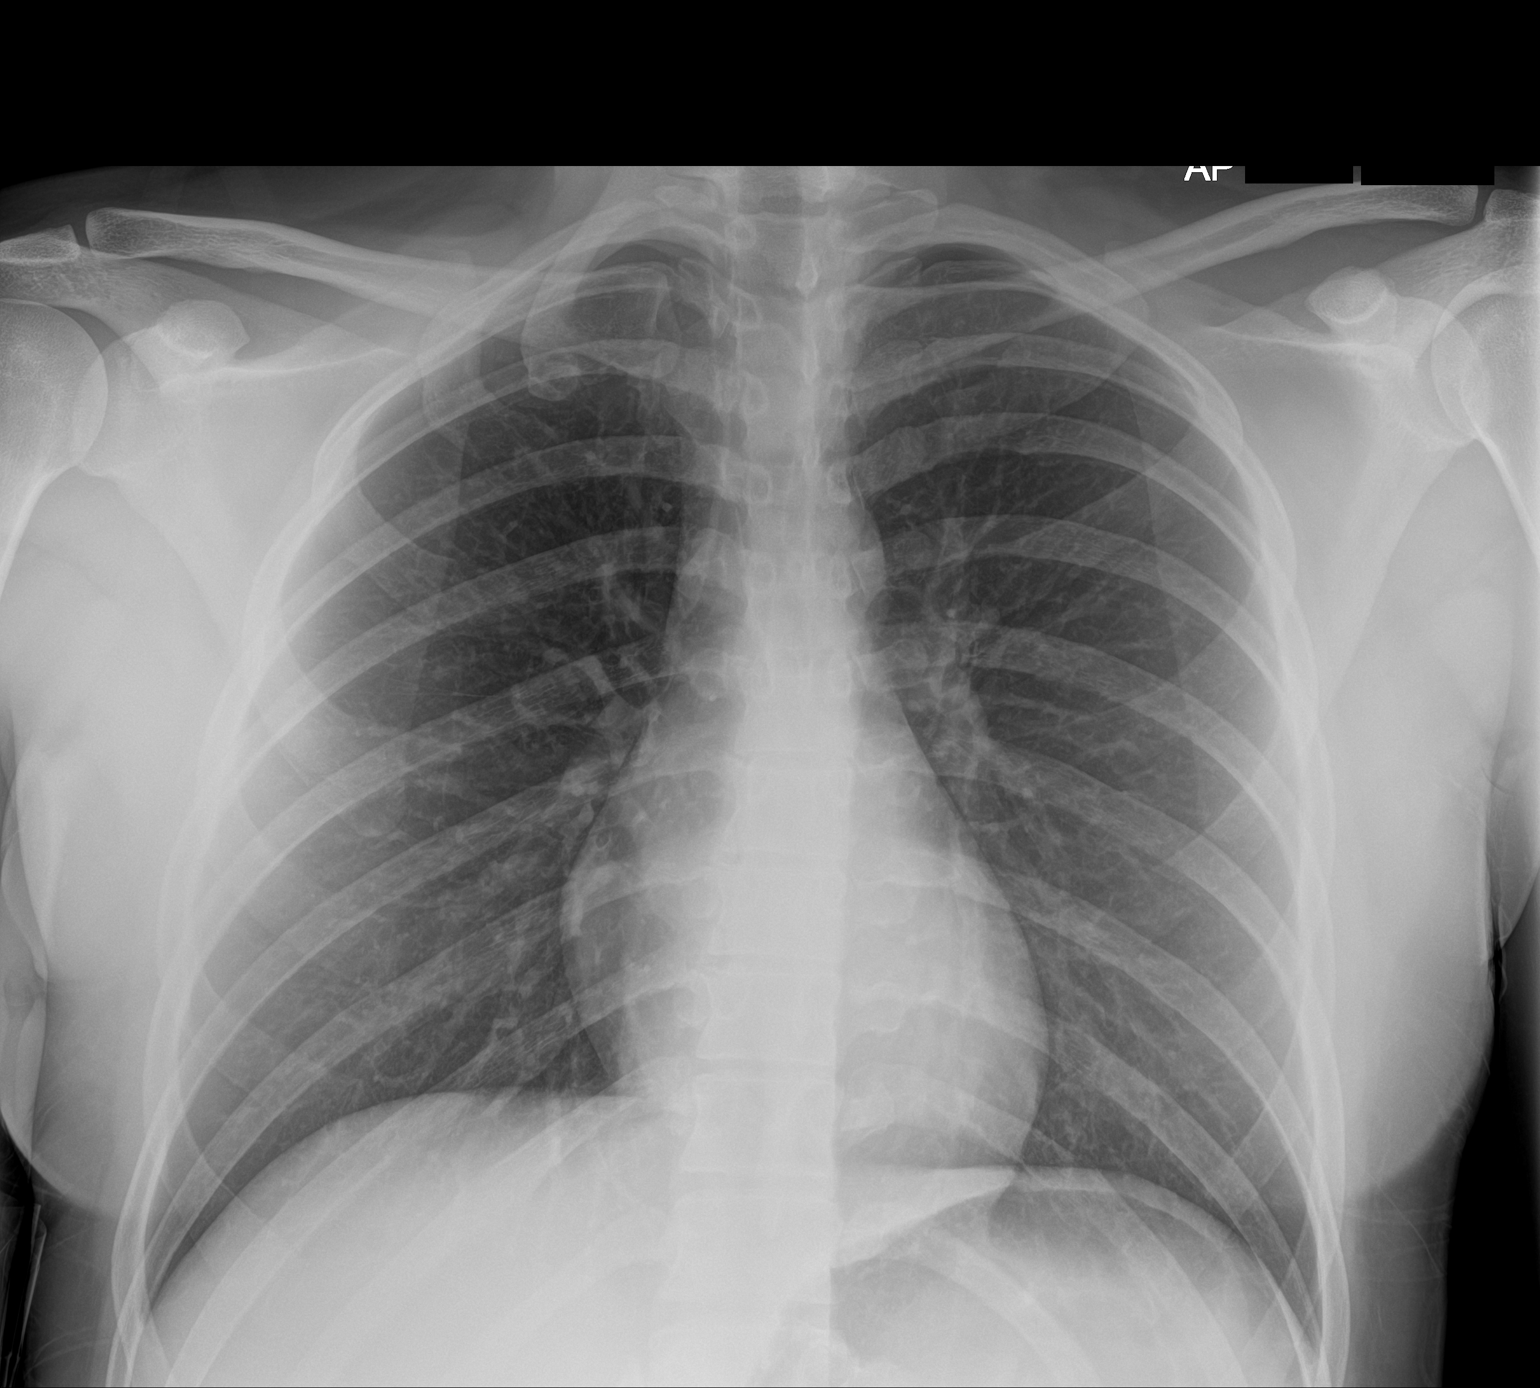

[1 of 1 positions shown; findings below may reference images not displayed]

FINDINGS: The heart size and mediastinal contours are within normal limits.
Both lungs are clear. The visualized skeletal structures are
unremarkable.
IMPRESSION: No active cardiopulmonary disease.

## 2019-12-11 DIAGNOSIS — Z0389 Encounter for observation for other suspected diseases and conditions ruled out: Secondary | ICD-10-CM | POA: Diagnosis not present

## 2019-12-11 DIAGNOSIS — Z3009 Encounter for other general counseling and advice on contraception: Secondary | ICD-10-CM | POA: Diagnosis not present

## 2019-12-11 DIAGNOSIS — Z1388 Encounter for screening for disorder due to exposure to contaminants: Secondary | ICD-10-CM | POA: Diagnosis not present

## 2019-12-12 ENCOUNTER — Ambulatory Visit: Payer: Medicaid Other

## 2019-12-14 ENCOUNTER — Other Ambulatory Visit: Payer: Self-pay

## 2019-12-14 ENCOUNTER — Emergency Department (HOSPITAL_COMMUNITY)
Admission: EM | Admit: 2019-12-14 | Discharge: 2019-12-14 | Disposition: A | Discharge status: Home / Self Care | Payer: Medicaid Other | Attending: Emergency Medicine | Admitting: Emergency Medicine

## 2019-12-14 ENCOUNTER — Encounter (HOSPITAL_COMMUNITY): Payer: Self-pay | Admitting: Emergency Medicine

## 2019-12-14 DIAGNOSIS — Z20822 Contact with and (suspected) exposure to covid-19: Secondary | ICD-10-CM | POA: Diagnosis not present

## 2019-12-14 DIAGNOSIS — F17210 Nicotine dependence, cigarettes, uncomplicated: Secondary | ICD-10-CM | POA: Diagnosis not present

## 2019-12-14 DIAGNOSIS — R059 Cough, unspecified: Secondary | ICD-10-CM

## 2019-12-14 DIAGNOSIS — R0981 Nasal congestion: Secondary | ICD-10-CM | POA: Diagnosis not present

## 2019-12-14 DIAGNOSIS — R05 Cough: Secondary | ICD-10-CM | POA: Insufficient documentation

## 2019-12-14 DIAGNOSIS — Z791 Long term (current) use of non-steroidal anti-inflammatories (NSAID): Secondary | ICD-10-CM | POA: Insufficient documentation

## 2019-12-14 LAB — SARS CORONAVIRUS 2 (TAT 6-24 HRS): SARS Coronavirus 2: NEGATIVE

## 2019-12-14 NOTE — ED Provider Notes (Signed)
Kristin Kline was evaluated in Emergency Department on 12/14/2019 for the symptoms described in the history of present illness. She was evaluated in the context of the global COVID-19 pandemic, which necessitated consideration that the patient might be at risk for infection with the SARS-CoV-2 virus that causes COVID-19. Institutional protocols and algorithms that pertain to the evaluation of patients at risk for COVID-19 are in a state of rapid change based on information released by regulatory bodies including the CDC and federal and state organizations. These policies and algorithms were followed during the patient's care in the ED.  Patient seen in conjunction with PA Joldersma. Refer to his note for full history & physical examination.  Briefly this is a healthy 29 year old female complaining of intermittent nasal congestion, scratchy throat, cough for 1 month.  Yesterday developed tinnitus.  She is resting comfortably, no evidence of respiratory distress and is speaking in full sentences without difficulty with stable SPO2 saturations.  Posterior oropharynx with no tonsillar hypertrophy, exudates, uvular deviation, trismus or sublingual abnormalities and she is tolerating secretions without difficulty.  No fever or meningeal signs.  No concern for strep pharyngitis, peritonsillar abscess, or deep space neck infection.  Doubt bacterial pneumonia given the duration of symptoms and lack of fever.  Suspect likely seasonal allergies versus URI.  Will be discharged with symptomatic management.  She is requesting Covid testing.  Discussed current quarantine guidelines per CDC requirements.  Discussed ED return precautions. Patient verbalized understanding of and agreement with plan and is safe for discharge home at this time.    Jeanie Sewer, PA-C 12/14/19 1557    Charlynne Pander, MD 12/14/19 [phone]

## 2019-12-14 NOTE — ED Notes (Signed)
Patient verbalizes understanding of discharge instructions. Opportunity for questioning and answers were provided. Armband removed by staff, pt discharged from. Pt able to ambulate to vehicle to home.

## 2019-12-14 NOTE — ED Provider Notes (Signed)
[facility] EMER[facility]ENC[facility] DEPARTMENT Provider Note   CSN: [id number] Arrival date & time: 12/14/19  1222     History Chief Complaint  Patient presents with  . Tinnitus  . Cough  . Nasal Congestion    Kristin Kline is a 29 y.o. female.  HPI HPI Comments: CYNCERE RUHE is a 29 y.o. female with no pertinent medical history who presents to the Emergency Department complaining of congestion for 1 month.  Patient states that for the past month she has been experiencing an intermittent dry cough that is worse at night as well as sinus congestion.  She denies any sore throat.  She also states that she experiences some mild diffuse chest pain when coughing.  Last night she began experiencing intermittent tinnitus in her bilateral ears.  She states she currently has mild tinnitus in her right ear.  She denies any hearing loss.  She states she smokes socially at the moment but up until last week smoked 1/4 pack a day.  She drinks alcohol socially.  She denies a known history of seasonal allergies, eczema, asthma.  She denies fevers, chills, sore throat, difficulty swallowing, visual changes, abdominal pain, nausea, vomiting, diarrhea, urinary changes, vaginal discharge, syncope.     Past Medical History:  Diagnosis Date  . Bacterial vaginitis 01/20/2017  . Chlamydia   . Gonorrhea   . Trichomoniasis 01/20/2017    Patient Active Problem List   Diagnosis Date Noted  . Post-operative state 05/18/2018  . History of cervical dysplasia 05/18/2018    Past Surgical History:  Procedure Laterality Date  . CERVICAL CONIZATION W/BX N/A 04/27/2018   Procedure: CONIZATION CERVIX WITH BIOPSY - COLD KNIFE AND ENDOCERVICAL CURETTAGE;  Surgeon: Chancy Milroy, MD;  Location: [facility];  Service: Gynecology;  Laterality: N/A;  . WISDOM TOOTH EXTRACTION       OB History    Gravida  1   Para  1   Term  1   Preterm      AB      Living  1     SAB      TAB       Ectopic      Multiple      Live Births  1           Family History  Problem Relation Age of Onset  . Diabetes Mother     Social History   Tobacco Use  . Smoking status: Current Every Day Smoker    Packs/day: 0.25    Years: 8.00    Pack years: 2.00    Types: Cigarettes  . Smokeless tobacco: Never Used  Substance Use Topics  . Alcohol use: Yes    Comment: social  . Drug use: Yes    Frequency: 4.0 times per week    Types: Marijuana    Home Medications Prior to Admission medications   Medication Sig Start Date End Date Taking? Authorizing Provider  naproxen (NAPROSYN) 500 MG tablet Take 1 tablet (500 mg total) by mouth 2 (two) times daily. 03/12/19   Kinnie Feil, PA-C  ondansetron (ZOFRAN ODT) 4 MG disintegrating tablet Take 1 tablet (4 mg total) by mouth every 8 (eight) hours as needed for nausea or vomiting. 03/12/19   Kinnie Feil, PA-C    Allergies    Patient has no known allergies.  Review of Systems   Review of Systems  Constitutional: Negative for chills and fever.  HENT: Positive for congestion,  postnasal drip, rhinorrhea and tinnitus. Negative for drooling, ear discharge, ear pain, hearing loss, sinus pain, trouble swallowing and voice change.   Respiratory: Positive for cough. Negative for shortness of breath.   Cardiovascular: Positive for chest pain (When coughing).  Genitourinary: Negative for dysuria and vaginal discharge.  Neurological: Negative for syncope.   Physical Exam Updated Vital Signs BP 120/67   Pulse 71   Temp 98.3 F (36.8 C) (Oral)   Resp 12   SpO2 99%   Physical Exam Vitals and nursing note reviewed.  Constitutional:      General: She is not in acute distress.    Appearance: Normal appearance. She is normal weight. She is not ill-appearing, toxic-appearing or diaphoretic.  HENT:     Head: Normocephalic and atraumatic.     Right Ear: Ear canal and external ear normal. There is no impacted cerumen.     Left  Ear: Ear canal and external ear normal. There is no impacted cerumen.     Ears:     Comments: EACs clear bilaterally.  Bilateral middle ear effusion noted.  TM is not bulging.  No erythema.  No purulence noted.    Nose: Congestion present.     Mouth/Throat:     Mouth: Mucous membranes are moist.     Pharynx: Oropharynx is clear. Posterior oropharyngeal erythema (Mild red streaking noted in the posterior oropharynx) present. No oropharyngeal exudate.  Eyes:     General: No scleral icterus.       Right eye: No discharge.        Left eye: No discharge.     Extraocular Movements: Extraocular movements intact.     Pupils: Pupils are equal, round, and reactive to light.  Cardiovascular:     Rate and Rhythm: Normal rate and regular rhythm.     Pulses: Normal pulses.     Heart sounds: Normal heart sounds. No murmur. No friction rub. No gallop.   Pulmonary:     Effort: Pulmonary effort is normal. No respiratory distress.     Breath sounds: Normal breath sounds. No stridor. No wheezing, rhonchi or rales.  Abdominal:     General: Abdomen is flat.     Tenderness: There is no abdominal tenderness.  Musculoskeletal:        General: Normal range of motion.  Skin:    General: Skin is warm and dry.     Capillary Refill: Capillary refill takes less than 2 seconds.  Neurological:     General: No focal deficit present.     Mental Status: She is alert and oriented to person, place, and time.  Psychiatric:        Mood and Affect: Mood normal.        Behavior: Behavior normal.    ED Results / Procedures / Treatments   Labs (all labs ordered are listed, but only abnormal results are displayed) Labs Reviewed  SARS CORONAVIRUS 2 (TAT 6-24 HRS)    EKG None  Radiology No results found.  Procedures Procedures   Medications Ordered in ED Medications - No data to display  ED Course  I have reviewed the triage vital signs and the nursing notes.  Pertinent labs & imaging results that were  available during my care of the patient were reviewed by me and considered in my medical decision making (see chart for details).    MDM Rules/Calculators/A&P  Patient is a pleasant 29 year old female that presents with intermittent dry cough and congestion for the last month.  Her physical exam is quite reassuring.  Her vital signs are stable.  She is not tachycardic.  Afebrile.  Lungs are clear.  Physical exam is consistent with seasonal allergies.  She is concerned that she has contracted COVID-19 but is unsure of any sick contacts.  Based on her request, will have patient tested for COVID-19 and discussed how she can look up her results on MyChart.  Will start patient on Claritin and Flonase for her allergies.  She can additionally take Benadryl at night if she feels her symptoms are refractory and she is having difficulty sleeping.  She understands to return to the emergency department with any new or worsening symptoms.  Her questions were answered and she was amicable at the time of discharge.  Vital signs stable.  Patient discharged to home/self care.  Condition at discharge: Stable  Note: Portions of this report may have been transcribed using voice recognition software. Every effort was made to ensure accuracy; however, inadvertent computerized transcription errors may be present.    Final Clinical Impression(s) / ED Diagnoses Final diagnoses:  Cough  Nasal congestion    Rx / DC Orders ED Discharge Orders    None       Placido Sou, PA-C 12/14/19 1703    Charlynne Pander, MD 12/14/19 2325

## 2019-12-14 NOTE — Discharge Instructions (Addendum)
Per our discussion, I would recommend that you begin taking 2 medications for your allergies.  Please start taking Claritin once a day in the mornings for your ongoing allergies.  This is an antihistamine but unlike Benadryl does not have as much of a sedating effect.  If you still have symptoms at night feel free to take a dose of Benadryl as needed to help with sleeping as well as your congestion.  Please also start taking Flonase once to twice a day.  You can take 1 to 2 puffs in each nostril.  This is a steroid medication that will help with inflammation in your sinuses.  You were tested for COVID-19 at this visit.  Per our discussion, you can check the results on [facility] MyChart in 1 to 2 days.  Please be sure to quarantine until you receive these results.  Please follow-up with your primary care provider regarding this visit and please feel free to return to the emergency department any new or worsening symptoms.  It was a pleasure to meet you.

## 2019-12-14 NOTE — ED Triage Notes (Addendum)
Pt reports tinnitus, cough and nasal congestion x1 month. Denies fevers or chills, n/v/d or loss of taste or smell. Only taking otc cough medicine. resp e/u, nad. No recent sick contacts.

## 2020-08-19 ENCOUNTER — Other Ambulatory Visit (HOSPITAL_COMMUNITY)
Admission: RE | Admit: 2020-08-19 | Discharge: 2020-08-19 | Disposition: A | Discharge status: Home / Self Care | Payer: Medicaid Other | Source: Ambulatory Visit | Attending: Obstetrics and Gynecology | Admitting: Obstetrics and Gynecology

## 2020-08-19 ENCOUNTER — Other Ambulatory Visit: Payer: Self-pay

## 2020-08-19 ENCOUNTER — Encounter: Payer: Self-pay | Admitting: Obstetrics and Gynecology

## 2020-08-19 ENCOUNTER — Ambulatory Visit (INDEPENDENT_AMBULATORY_CARE_PROVIDER_SITE_OTHER): Payer: Medicaid Other | Admitting: Obstetrics and Gynecology

## 2020-08-19 VITALS — BP 126/64 | HR 69 | Ht 66.0 in | Wt 188.6 lb

## 2020-08-19 DIAGNOSIS — Z01419 Encounter for gynecological examination (general) (routine) without abnormal findings: Secondary | ICD-10-CM | POA: Diagnosis not present

## 2020-08-19 DIAGNOSIS — Z113 Encounter for screening for infections with a predominantly sexual mode of transmission: Secondary | ICD-10-CM | POA: Insufficient documentation

## 2020-08-19 DIAGNOSIS — Z87410 Personal history of cervical dysplasia: Secondary | ICD-10-CM

## 2020-08-19 NOTE — Addendum Note (Signed)
Addended by: Henrietta Dine on: 08/19/2020 11:37 AM   Modules accepted: Orders

## 2020-08-19 NOTE — Progress Notes (Signed)
Patient ID: Kristin Kline, female   DOB: [birth year], 29 y.o.   MRN: [id number]  Kristin Kline is a 29 y.o. G7P1001 female here for a routine annual gynecologic exam.     Denies abnormal vaginal bleeding, discharge, pelvic pain, problems with intercourse or other gynecologic concerns.    Gynecologic History Patient's last menstrual period was 07/31/2020 (exact date). Contraception: condoms Last Pap: 09/2018. Results were: normal Last mammogram:NA  Obstetric History OB History  Gravida Para Term Preterm AB Living  1 1 1     1   SAB IAB Ectopic Multiple Live Births          1    # Outcome Date GA Lbr Len/2nd Weight Sex Delivery Anes PTL Lv  1 Term 01/01/11 [redacted]w[redacted]d  6 lb 12 oz (3.062 kg)  Vag-Spont       Past Medical History:  Diagnosis Date  . Bacterial vaginitis 01/20/2017  . Chlamydia   . Gonorrhea   . Trichomoniasis 01/20/2017    Past Surgical History:  Procedure Laterality Date  . CERVICAL CONIZATION W/BX N/A 04/27/2018   Procedure: CONIZATION CERVIX WITH BIOPSY - COLD KNIFE AND ENDOCERVICAL CURETTAGE;  Surgeon: 04/29/2018, MD;  Location: [facility];  Service: Gynecology;  Laterality: N/A;  . WISDOM TOOTH EXTRACTION      Current Outpatient Medications on File Prior to Visit  Medication Sig Dispense Refill  . naproxen (NAPROSYN) 500 MG tablet Take 1 tablet (500 mg total) by mouth 2 (two) times daily. 30 tablet 0  . ondansetron (ZOFRAN ODT) 4 MG disintegrating tablet Take 1 tablet (4 mg total) by mouth every 8 (eight) hours as needed for nausea or vomiting. 20 tablet 0   No current facility-administered medications on file prior to visit.    No Known Allergies  Social History   Socioeconomic History  . Marital status: Single    Spouse name: Not on file  . Number of children: Not on file  . Years of education: Not on file  . Highest education level: Not on file  Occupational History  . Not on file  Tobacco Use  . Smoking status: Current Every  Day Smoker    Packs/day: 0.25    Years: 8.00    Pack years: 2.00    Types: Cigarettes  . Smokeless tobacco: Never Used  Vaping Use  . Vaping Use: Never used  Substance and Sexual Activity  . Alcohol use: Yes    Comment: social  . Drug use: Yes    Frequency: 4.0 times per week    Types: Marijuana  . Sexual activity: Yes    Birth control/protection: Condom, None  Other Topics Concern  . Not on file  Social History Narrative  . Not on file   Social Determinants of Health   Financial Resource Strain: Not on file  Food Insecurity: Not on file  Transportation Needs: Not on file  Physical Activity: Not on file  Stress: Not on file  Social Connections: Not on file  Intimate Partner Violence: Not on file    Family History  Problem Relation Age of Onset  . Diabetes Mother     The following portions of the patient's history were reviewed and updated as appropriate: allergies, current medications, past family history, past medical history, past social history, past surgical history and problem list.  Review of Systems Pertinent items are noted in HPI.   Objective:  BP 126/64   Pulse 69   Ht 5\' 6"  (1.676 m)  Wt 188 lb 9.6 oz (85.5 kg)   LMP 07/31/2020 (Exact Date)   BMI 30.44 kg/m  CONSTITUTIONAL: Well-developed, well-nourished female in no acute distress.  HENT:  Normocephalic, atraumatic, External right and left ear normal. Oropharynx is clear and moist EYES: Conjunctivae and EOM are normal. Pupils are equal, round, and reactive to light. No scleral icterus.  NECK: Normal range of motion, supple, no masses.  Normal thyroid.  SKIN: Skin is warm and dry. No rash noted. Not diaphoretic. No erythema. No pallor. NEUROLGIC: Alert and oriented to person, place, and time. Normal reflexes, muscle tone coordination. No cranial nerve deficit noted. PSYCHIATRIC: Normal mood and affect. Normal behavior. Normal judgment and thought content. CARDIOVASCULAR: Normal heart rate noted,  regular rhythm RESPIRATORY: Clear to auscultation bilaterally. Effort and breath sounds normal, no problems with respiration noted. BREASTS: Symmetric in size. No masses, skin changes, nipple drainage, or lymphadenopathy. ABDOMEN: Soft, normal bowel sounds, no distention noted.  No tenderness, rebound or guarding.  PELVIC: Normal appearing external genitalia; normal appearing vaginal mucosa and cervix.  No abnormal discharge noted.  Pap smear obtained.  Normal uterine size, no other palpable masses, no uterine or adnexal tenderness. MUSCULOSKELETAL: Normal range of motion. No tenderness.  No cyanosis, clubbing, or edema.  2+ distal pulses.   Assessment:  Annual gynecologic examination with pap smear   Plan:  Will follow up results of pap smear and manage accordingly. Declines contraception Routine preventative health maintenance measures emphasized. Please refer to After Visit Summary for other counseling recommendations.    Hermina Staggers, MD, FACOG Attending Obstetrician & Gynecologist Center for [facility], [facility]

## 2020-08-19 NOTE — Progress Notes (Signed)
Pt does notwant to discuss BC 

## 2020-08-19 NOTE — Patient Instructions (Signed)
Health Maintenance, Female Adopting a healthy lifestyle and getting preventive care are important in promoting health and wellness. Ask your health care provider about:  The right schedule for you to have regular tests and exams.  Things you can do on your own to prevent diseases and keep yourself healthy. What should I know about diet, weight, and exercise? Eat a healthy diet   Eat a diet that includes plenty of vegetables, fruits, low-fat dairy products, and lean protein.  Do not eat a lot of foods that are high in solid fats, added sugars, or sodium. Maintain a healthy weight Body mass index (BMI) is used to identify weight problems. It estimates body fat based on height and weight. Your health care provider can help determine your BMI and help you achieve or maintain a healthy weight. Get regular exercise Get regular exercise. This is one of the most important things you can do for your health. Most adults should:  Exercise for at least 150 minutes each week. The exercise should increase your heart rate and make you sweat (moderate-intensity exercise).  Do strengthening exercises at least twice a week. This is in addition to the moderate-intensity exercise.  Spend less time sitting. Even light physical activity can be beneficial. Watch cholesterol and blood lipids Have your blood tested for lipids and cholesterol at 27 years of age, then have this test every 5 years. Have your cholesterol levels checked more often if:  Your lipid or cholesterol levels are high.  You are older than 27 years of age.  You are at high risk for heart disease. What should I know about cancer screening? Depending on your health history and family history, you may need to have cancer screening at various ages. This may include screening for:  Breast cancer.  Cervical cancer.  Colorectal cancer.  Skin cancer.  Lung cancer. What should I know about heart disease, diabetes, and high blood  pressure? Blood pressure and heart disease  High blood pressure causes heart disease and increases the risk of stroke. This is more likely to develop in people who have high blood pressure readings, are of African descent, or are overweight.  Have your blood pressure checked: ? Every 3-5 years if you are 18-39 years of age. ? Every year if you are 40 years old or older. Diabetes Have regular diabetes screenings. This checks your fasting blood sugar level. Have the screening done:  Once every three years after age 40 if you are at a normal weight and have a low risk for diabetes.  More often and at a younger age if you are overweight or have a high risk for diabetes. What should I know about preventing infection? Hepatitis B If you have a higher risk for hepatitis B, you should be screened for this virus. Talk with your health care provider to find out if you are at risk for hepatitis B infection. Hepatitis C Testing is recommended for:  Everyone born from 1945 through 1965.  Anyone with known risk factors for hepatitis C. Sexually transmitted infections (STIs)  Get screened for STIs, including gonorrhea and chlamydia, if: ? You are sexually active and are younger than 27 years of age. ? You are older than 27 years of age and your health care provider tells you that you are at risk for this type of infection. ? Your sexual activity has changed since you were last screened, and you are at increased risk for chlamydia or gonorrhea. Ask your health care provider if   you are at risk.  Ask your health care provider about whether you are at high risk for HIV. Your health care provider may recommend a prescription medicine to help prevent HIV infection. If you choose to take medicine to prevent HIV, you should first get tested for HIV. You should then be tested every 3 months for as long as you are taking the medicine. Pregnancy  If you are about to stop having your period (premenopausal) and  you may become pregnant, seek counseling before you get pregnant.  Take 400 to 800 micrograms (mcg) of folic acid every day if you become pregnant.  Ask for birth control (contraception) if you want to prevent pregnancy. Osteoporosis and menopause Osteoporosis is a disease in which the bones lose minerals and strength with aging. This can result in bone fractures. If you are 65 years old or older, or if you are at risk for osteoporosis and fractures, ask your health care provider if you should:  Be screened for bone loss.  Take a calcium or vitamin D supplement to lower your risk of fractures.  Be given hormone replacement therapy (HRT) to treat symptoms of menopause. Follow these instructions at home: Lifestyle  Do not use any products that contain nicotine or tobacco, such as cigarettes, e-cigarettes, and chewing tobacco. If you need help quitting, ask your health care provider.  Do not use street drugs.  Do not share needles.  Ask your health care provider for help if you need support or information about quitting drugs. Alcohol use  Do not drink alcohol if: ? Your health care provider tells you not to drink. ? You are pregnant, may be pregnant, or are planning to become pregnant.  If you drink alcohol: ? Limit how much you use to 0-1 drink a day. ? Limit intake if you are breastfeeding.  Be aware of how much alcohol is in your drink. In the [place] one drink equals one 12 oz bottle of beer (355 mL), one 5 oz glass of wine (148 mL), or one 1 oz glass of hard liquor (44 mL). General instructions  Schedule regular health, dental, and eye exams.  Stay current with your vaccines.  Tell your health care provider if: ? You often feel depressed. ? You have ever been abused or do not feel safe at home. Summary  Adopting a healthy lifestyle and getting preventive care are important in promoting health and wellness.  Follow your health care provider's instructions about healthy  diet, exercising, and getting tested or screened for diseases.  Follow your health care provider's instructions on monitoring your cholesterol and blood pressure. This information is not intended to replace advice given to you by your health care provider. Make sure you discuss any questions you have with your health care provider. Document Revised: 08/17/2018 Document Reviewed: 08/17/2018 Elsevier Patient Education  2020 Elsevier Inc.  

## 2020-08-20 LAB — CERVICOVAGINAL ANCILLARY ONLY
Bacterial Vaginitis (gardnerella): POSITIVE — AB
Candida Glabrata: NEGATIVE
Candida Vaginitis: POSITIVE — AB
Chlamydia: NEGATIVE
Comment: NEGATIVE
Comment: NEGATIVE
Comment: NEGATIVE
Comment: NEGATIVE
Comment: NEGATIVE
Comment: NORMAL
Neisseria Gonorrhea: NEGATIVE
Trichomonas: POSITIVE — AB

## 2020-08-20 LAB — HEPATITIS B SURFACE ANTIGEN: Hepatitis B Surface Ag: NEGATIVE

## 2020-08-20 LAB — RPR: RPR Ser Ql: NONREACTIVE

## 2020-08-20 LAB — CYTOLOGY - PAP: Diagnosis: NEGATIVE

## 2020-08-20 LAB — HIV ANTIBODY (ROUTINE TESTING W REFLEX): HIV Screen 4th Generation wRfx: NONREACTIVE

## 2020-08-20 LAB — HEPATITIS C ANTIBODY: Hep C Virus Ab: <0.1 s/co ratio (ref 0.0–0.9)

## 2020-08-22 ENCOUNTER — Other Ambulatory Visit: Payer: Self-pay | Admitting: Lactation Services

## 2020-08-22 MED ORDER — METRONIDAZOLE 500 MG PO TABS: 500.0000 mg | ORAL_TABLET | Freq: Two times a day (BID) | ORAL | 0 refills | Status: DC

## 2020-08-22 MED ORDER — FLUCONAZOLE 150 MG PO TABS: 150.0000 mg | ORAL_TABLET | ORAL | 0 refills | Status: DC

## 2020-09-12 ENCOUNTER — Other Ambulatory Visit (HOSPITAL_COMMUNITY)
Admission: RE | Admit: 2020-09-12 | Discharge: 2020-09-12 | Disposition: A | Discharge status: Home / Self Care | Payer: Medicaid Other | Source: Ambulatory Visit | Attending: Family Medicine | Admitting: Family Medicine

## 2020-09-12 ENCOUNTER — Ambulatory Visit (INDEPENDENT_AMBULATORY_CARE_PROVIDER_SITE_OTHER): Payer: Medicaid Other | Admitting: General Practice

## 2020-09-12 ENCOUNTER — Other Ambulatory Visit: Payer: Self-pay

## 2020-09-12 ENCOUNTER — Other Ambulatory Visit: Payer: Self-pay | Admitting: Obstetrics & Gynecology

## 2020-09-12 DIAGNOSIS — Z8619 Personal history of other infectious and parasitic diseases: Secondary | ICD-10-CM | POA: Diagnosis not present

## 2020-09-12 MED ORDER — NYSTATIN 100000 UNIT/ML MT SUSP: 5.0000 mL | Freq: Four times a day (QID) | OROMUCOSAL | 0 refills | Status: AC

## 2020-09-12 NOTE — Progress Notes (Signed)
Patient presents to office today for test of cure following recent + trichomonas infection. Patient reports completing treatment and hasn't been with partner since. She states around Christmas she developed this white coating over her tongue that hasn't gone away. Patient denies itching, states her tongue just feels weird especially with swallowing. Patient's tongue is coated with white patches- suspicious for thrush. Dr Hyacinth Meeker assessed patient and agrees with thrush diagnosis. Nystatin order sent to pharmacy and patient informed. Instructed patient in self swab & specimen collected. Discussed results take approximately 24-48 hours to finalize and will be available via mychart. Patient verbalized understanding.  Chase Caller RN BSN 09/12/20

## 2020-09-13 LAB — CERVICOVAGINAL ANCILLARY ONLY
Chlamydia: NEGATIVE
Comment: NEGATIVE
Comment: NEGATIVE
Comment: NORMAL
Neisseria Gonorrhea: NEGATIVE
Trichomonas: NEGATIVE

## 2020-10-23 ENCOUNTER — Other Ambulatory Visit (HOSPITAL_COMMUNITY)
Admission: RE | Admit: 2020-10-23 | Discharge: 2020-10-23 | Disposition: A | Discharge status: Home / Self Care | Payer: Medicaid Other | Source: Ambulatory Visit | Attending: Family Medicine | Admitting: Family Medicine

## 2020-10-23 ENCOUNTER — Telehealth: Payer: Self-pay

## 2020-10-23 ENCOUNTER — Ambulatory Visit (INDEPENDENT_AMBULATORY_CARE_PROVIDER_SITE_OTHER): Payer: Medicaid Other | Admitting: Family Medicine

## 2020-10-23 ENCOUNTER — Other Ambulatory Visit: Payer: Self-pay

## 2020-10-23 ENCOUNTER — Encounter: Payer: Self-pay | Admitting: Family Medicine

## 2020-10-23 VITALS — BP 112/79 | HR 70 | Ht 66.0 in | Wt 184.5 lb

## 2020-10-23 DIAGNOSIS — Z7721 Contact with and (suspected) exposure to potentially hazardous body fluids: Secondary | ICD-10-CM

## 2020-10-23 DIAGNOSIS — N898 Other specified noninflammatory disorders of vagina: Secondary | ICD-10-CM | POA: Insufficient documentation

## 2020-10-23 MED ORDER — FLUCONAZOLE 150 MG PO TABS: 150.0000 mg | ORAL_TABLET | ORAL | 0 refills | Status: DC

## 2020-10-23 NOTE — Telephone Encounter (Signed)
Pt left voicemail on nurse line requesting a callback regarding medication.

## 2020-10-23 NOTE — Progress Notes (Signed)
   Subjective:    Patient ID: Kristin Kline, female    DOB: 07-02-1991, 30 y.o.   MRN: [id number]  HPI  Patient seen for concerns of vaginal itching that started over the weekend. No palliating or provoking factors. No abnormal discharge. Reports no new sexual partners, but concerned about STIs.  Review of Systems     Objective:   Physical Exam Vitals reviewed. Exam conducted with a chaperone present.  Pulmonary:     Effort: Pulmonary effort is normal.  Abdominal:     Hernia: There is no hernia in the left inguinal area or right inguinal area.  Genitourinary:    Exam position: Knee-chest position.     Labia:        Right: No rash, tenderness or lesion.        Left: No rash, tenderness or lesion.      Vagina: Vaginal discharge (white discharge) present.  Lymphadenopathy:     Lower Body: No right inguinal adenopathy. No left inguinal adenopathy.  Neurological:     Mental Status: She is alert.  Psychiatric:        Mood and Affect: Mood normal.        Thought Content: Thought content normal.        Judgment: Judgment normal.       Assessment & Plan:  1. Vaginal itching Will start treatment for yeast. - Cervicovaginal ancillary only( [place]) - HIV Antibody (routine testing w rflx) - RPR - Hepatitis B Surface AntiGEN - Hepatitis C Antibody  2. Exposure to body fluid  - HIV Antibody (routine testing w rflx) - RPR - Hepatitis B Surface AntiGEN - Hepatitis C Antibody

## 2020-10-23 NOTE — Progress Notes (Signed)
Pt states vagina started itching thgis weekend, no odor nor discharge.

## 2020-10-24 LAB — CERVICOVAGINAL ANCILLARY ONLY
Bacterial Vaginitis (gardnerella): POSITIVE — AB
Candida Glabrata: NEGATIVE
Candida Vaginitis: POSITIVE — AB
Chlamydia: NEGATIVE
Comment: NEGATIVE
Comment: NEGATIVE
Comment: NEGATIVE
Comment: NEGATIVE
Comment: NEGATIVE
Comment: NORMAL
Neisseria Gonorrhea: NEGATIVE
Trichomonas: POSITIVE — AB

## 2020-10-24 LAB — HEPATITIS C ANTIBODY: Hep C Virus Ab: <0.1 s/co ratio (ref 0.0–0.9)

## 2020-10-24 LAB — HIV ANTIBODY (ROUTINE TESTING W REFLEX): HIV Screen 4th Generation wRfx: NONREACTIVE

## 2020-10-24 LAB — HEPATITIS B SURFACE ANTIGEN: Hepatitis B Surface Ag: NEGATIVE

## 2020-10-24 LAB — RPR: RPR Ser Ql: NONREACTIVE

## 2020-10-24 MED ORDER — FLUCONAZOLE 150 MG PO TABS: 150.0000 mg | ORAL_TABLET | ORAL | 0 refills | Status: DC

## 2020-10-24 MED ORDER — METRONIDAZOLE 500 MG PO TABS: 500.0000 mg | ORAL_TABLET | Freq: Two times a day (BID) | ORAL | 0 refills | Status: DC

## 2020-10-24 NOTE — Addendum Note (Signed)
Addended by: Levie Heritage on: 10/24/2020 12:28 PM   Modules accepted: Orders

## 2020-10-25 MED ORDER — NYSTATIN 100000 UNIT/ML MT SUSP: 5.0000 mL | Freq: Four times a day (QID) | OROMUCOSAL | 0 refills | Status: DC

## 2020-10-25 NOTE — Addendum Note (Signed)
Addended by: Faythe Casa on: 10/25/2020 10:15 AM   Modules accepted: Orders

## 2020-10-25 NOTE — Telephone Encounter (Signed)
Called pt and pt requested to have an Rx for thrush because the medications that she was prescribed usually gives her thrush in her mouth.  Per Dr. Macon Large pt can have an Rx for Nystatin.  Pt advised that the medication that she requested will be sent to her [facility] on [address].  Pt verbalized understanding.   Leonette Nutting  10/25/20

## 2020-10-28 ENCOUNTER — Ambulatory Visit: Payer: Medicaid Other

## 2020-10-30 ENCOUNTER — Ambulatory Visit: Payer: Medicaid Other

## 2020-11-07 ENCOUNTER — Encounter: Payer: Self-pay | Admitting: Lactation Services

## 2020-11-07 ENCOUNTER — Other Ambulatory Visit: Payer: Self-pay

## 2020-11-07 ENCOUNTER — Ambulatory Visit (INDEPENDENT_AMBULATORY_CARE_PROVIDER_SITE_OTHER): Payer: Medicaid Other | Admitting: Lactation Services

## 2020-11-07 DIAGNOSIS — Z202 Contact with and (suspected) exposure to infections with a predominantly sexual mode of transmission: Secondary | ICD-10-CM

## 2020-11-07 LAB — POCT URINALYSIS DIP (DEVICE)
Bilirubin Urine: NEGATIVE
Glucose, UA: NEGATIVE mg/dL
Hgb urine dipstick: NEGATIVE
Ketones, ur: NEGATIVE mg/dL
Leukocytes,Ua: NEGATIVE
Nitrite: NEGATIVE
Protein, ur: NEGATIVE mg/dL
Specific Gravity, Urine: >=1.03 (ref 1.005–1.030)
Urobilinogen, UA: 0.2 mg/dL (ref 0.0–1.0)
pH: 6.5 (ref 5.0–8.0)

## 2020-11-07 NOTE — Progress Notes (Signed)
Patient in office for concerns with urinary burning that started the weekend before she was treated for recent infections. She reports that the burning has ceased since treatments. Clean catch urine instructions given and Urine obtained.  UA unremarkable.  Patient reports vaginal discharge is unremarkable and no odor. She has follow up TOC on 3/23. It has only been 2 weeks since Dx and treatment so too early to obtain repeat vaginal swab at this time. She will return on or after 3/10 for TOC.

## 2020-11-11 NOTE — Progress Notes (Signed)
Patient was assessed and managed by nursing staff during this encounter. I have reviewed the chart and agree with the documentation and plan. I have also made any necessary editorial changes.  [place] Bing, MD 11/11/2020 8:29 AM

## 2020-11-13 ENCOUNTER — Ambulatory Visit: Payer: Medicaid Other

## 2020-11-14 ENCOUNTER — Ambulatory Visit (INDEPENDENT_AMBULATORY_CARE_PROVIDER_SITE_OTHER): Payer: Medicaid Other

## 2020-11-14 ENCOUNTER — Other Ambulatory Visit: Payer: Self-pay

## 2020-11-14 ENCOUNTER — Other Ambulatory Visit (HOSPITAL_COMMUNITY)
Admission: RE | Admit: 2020-11-14 | Discharge: 2020-11-14 | Disposition: A | Discharge status: Home / Self Care | Payer: Medicaid Other | Source: Ambulatory Visit | Attending: Family Medicine | Admitting: Family Medicine

## 2020-11-14 VITALS — BP 112/73 | HR 89 | Wt 184.7 lb

## 2020-11-14 DIAGNOSIS — Z202 Contact with and (suspected) exposure to infections with a predominantly sexual mode of transmission: Secondary | ICD-10-CM | POA: Insufficient documentation

## 2020-11-14 NOTE — Progress Notes (Signed)
Pt here today for TOC after testing positive for Trich on 10/23/20.  Pt reports that she has taken Flagyl twice a day for seven days for treatment and that her partner(s) has gotten treated as well.  Pt explained how to obtain self swab and that we will call with abnormal results in 24-48 hours.  Pt verbalized understanding with no further questions.   Kristin Kline  11/14/20

## 2020-11-15 LAB — CERVICOVAGINAL ANCILLARY ONLY
Bacterial Vaginitis (gardnerella): POSITIVE — AB
Candida Glabrata: NEGATIVE
Candida Vaginitis: NEGATIVE
Chlamydia: NEGATIVE
Comment: NEGATIVE
Comment: NEGATIVE
Comment: NEGATIVE
Comment: NEGATIVE
Comment: NEGATIVE
Comment: NORMAL
Neisseria Gonorrhea: NEGATIVE
Trichomonas: NEGATIVE

## 2020-11-15 NOTE — Progress Notes (Signed)
Chart reviewed for nurse visit. Agree with plan of care.   Marylene Land, CNM 11/15/2020 10:54 AM

## 2020-11-27 ENCOUNTER — Ambulatory Visit: Payer: Medicaid Other

## 2021-02-26 ENCOUNTER — Encounter: Payer: Self-pay | Admitting: Obstetrics and Gynecology

## 2021-02-26 ENCOUNTER — Other Ambulatory Visit: Payer: Self-pay

## 2021-02-26 ENCOUNTER — Ambulatory Visit: Payer: Medicaid Other | Admitting: Obstetrics and Gynecology

## 2021-02-26 ENCOUNTER — Other Ambulatory Visit (HOSPITAL_COMMUNITY)
Admission: RE | Admit: 2021-02-26 | Discharge: 2021-02-26 | Disposition: A | Discharge status: Home / Self Care | Payer: Medicaid Other | Source: Ambulatory Visit | Attending: Obstetrics and Gynecology | Admitting: Obstetrics and Gynecology

## 2021-02-26 DIAGNOSIS — Z113 Encounter for screening for infections with a predominantly sexual mode of transmission: Secondary | ICD-10-CM | POA: Insufficient documentation

## 2021-02-26 NOTE — Progress Notes (Signed)
  CC:  STD check Subjective:    Patient ID: Kristin Kline, female    DOB: October 24, 1990, 30 y.o.   MRN: [id number]  Exposure to STD  The patient's pertinent negatives include no pelvic pain.  Pt presents for STD check.  She denies any exposure at this time.  She currently has no symptoms.  Review of Systems  Genitourinary:  Negative for pelvic pain and vaginal discharge.      Objective:   Physical Exam Vitals:   02/26/21 0831  BP: 118/63  Pulse: 73   By patient request she wanted examination of her throat for thrush or other irritation.  No thrush noted.  Normal throat with subjectively mildly enlarged left tonsil.       Assessment & Plan:   1. Screening examination for STD (sexually transmitted disease) Pt did self swab, blood drawn - Cervicovaginal ancillary only( [place]) - Hepatitis B Surface AntiGEN - HIV Antibody (routine testing w rflx) - RPR  Pt advised to follow up for AE in 6 months (08/2021) Pap is current.  Warden Fillers, MD Faculty Attending, Center for [facility]

## 2021-02-27 LAB — CERVICOVAGINAL ANCILLARY ONLY
Bacterial Vaginitis (gardnerella): NEGATIVE
Candida Glabrata: NEGATIVE
Candida Vaginitis: NEGATIVE
Chlamydia: NEGATIVE
Comment: NEGATIVE
Comment: NEGATIVE
Comment: NEGATIVE
Comment: NEGATIVE
Comment: NEGATIVE
Comment: NORMAL
Neisseria Gonorrhea: NEGATIVE
Trichomonas: NEGATIVE

## 2021-02-27 LAB — RPR: RPR Ser Ql: NONREACTIVE

## 2021-02-27 LAB — HIV ANTIBODY (ROUTINE TESTING W REFLEX): HIV Screen 4th Generation wRfx: NONREACTIVE

## 2021-02-27 LAB — HEPATITIS B SURFACE ANTIGEN: Hepatitis B Surface Ag: NEGATIVE

## 2021-09-12 ENCOUNTER — Ambulatory Visit: Payer: Medicaid Other | Admitting: Obstetrics and Gynecology

## 2021-09-30 ENCOUNTER — Other Ambulatory Visit: Payer: Self-pay

## 2021-09-30 ENCOUNTER — Ambulatory Visit (INDEPENDENT_AMBULATORY_CARE_PROVIDER_SITE_OTHER): Payer: Medicaid Other | Admitting: Family Medicine

## 2021-09-30 ENCOUNTER — Encounter: Payer: Self-pay | Admitting: Family Medicine

## 2021-09-30 ENCOUNTER — Other Ambulatory Visit (HOSPITAL_COMMUNITY)
Admission: RE | Admit: 2021-09-30 | Discharge: 2021-09-30 | Disposition: A | Discharge status: Home / Self Care | Payer: Medicaid Other | Source: Ambulatory Visit | Attending: Family Medicine | Admitting: Family Medicine

## 2021-09-30 VITALS — BP 101/48 | HR 75 | Wt 190.0 lb

## 2021-09-30 DIAGNOSIS — Z01419 Encounter for gynecological examination (general) (routine) without abnormal findings: Secondary | ICD-10-CM | POA: Insufficient documentation

## 2021-09-30 NOTE — Progress Notes (Addendum)
ANNUAL EXAM Patient name: CLARIECE ROESLER MRN [id number]  Date of birth: October 06, 1990 Chief Complaint:   Gynecologic Exam  History of Present Illness:   KHAMIA STAMBAUGH is a 31 y.o. G1P1022female being seen today for a routine annual exam.  Current complaints: None. Would like STI screening  Patient's last menstrual period was 09/10/2021.   The pregnancy intention screening data noted above was reviewed. Potential methods of contraception were discussed. The patient elected to proceed with condoms   Family history of cervical cancer Family hx of diabetes  Last pap 08/19/2020 Results were: NILM w/ HRHPV negative. H/O abnormal pap: no Last mammogram: none. Results were: N/A. Family h/o breast cancer: no Last colonoscopy: none. Results were: N/A. Family h/o colorectal cancer: no  Depression screen [facility] 2/9 02/26/2021 11/07/2020 10/23/2020 09/18/2020 08/19/2020  Decreased Interest 0 0 0 0 0  Down, Depressed, Hopeless 1 0 0 0 0  PHQ - 2 Score 1 0 0 0 0  Altered sleeping 1 0 0 1 1  Tired, decreased energy 1 0 0 1 1  Change in appetite 0 0 0 0 0  Feeling bad or failure about yourself  0 0 0 0 0  Trouble concentrating 0 0 0 0 0  Moving slowly or fidgety/restless 0 0 0 0 0  Suicidal thoughts 0 0 0 0 0  PHQ-9 Score 3 0 0 2 2     GAD 7 : Generalized Anxiety Score 02/26/2021 11/07/2020 10/23/2020 09/18/2020  Nervous, Anxious, on Edge 0 0 0 0  Control/stop worrying 0 0 0 0  Worry too much - different things 0 1 0 0  Trouble relaxing 0 1 0 0  Restless 0 0 0 0  Easily annoyed or irritable 0 0 0 0  Afraid - awful might happen 0 0 0 0  Total GAD 7 Score 0 2 0 0     Review of Systems:   Pertinent items are noted in HPI Denies any headaches, blurred vision, fatigue, shortness of breath, chest pain, abdominal pain, abnormal vaginal discharge/itching/odor/irritation, problems with periods, bowel movements, urination, or intercourse unless otherwise stated above. Pertinent History Reviewed:   Reviewed past medical,surgical, social and family history.  Reviewed problem list, medications and allergies. Physical Assessment:   Vitals:   09/30/21 1006  BP: (!) 101/48  Pulse: 75  Weight: 190 lb (86.2 kg)  Body mass index is 30.67 kg/m.        Physical Examination:   General appearance - well appearing, and in no distress  Mental status - alert, oriented to person, place, and time  Psych:  She has a normal mood and affect  Skin - warm and dry, normal color, no suspicious lesions noted  Chest - effort normal  Heart - normal rate   Neck:  midline trachea, no thyromegaly or nodules  Breasts - exam deferred as no acute concerns and low risk  Abdomen - soft, nontender, nondistended     No results found for this or any previous visit (from the past 24 hour(s)).  Assessment & Plan:  1) Well-Woman Exam Doing well. STI screening today. Would like to self swab. Using condoms for contraception.  - no other concerns - follow up in 1 year  2) Family hx of diabetes Patient has never had screening for diabetes. A1C done today  Labs/procedures today: STI screening and screening for diabetes  Mammogram:  @age  50 , or sooner if problems Colonoscopy: @ 29yo, or sooner if problems  No orders  of the defined types were placed in this encounter.   Meds: No orders of the defined types were placed in this encounter.   Follow-up: PRN or in 1 year for annual visit  Warner Mccreedy, MD, MPH OB Fellow, Faculty Practice

## 2021-10-01 LAB — CERVICOVAGINAL ANCILLARY ONLY
Bacterial Vaginitis (gardnerella): POSITIVE — AB
Candida Glabrata: NEGATIVE
Candida Vaginitis: NEGATIVE
Chlamydia: NEGATIVE
Comment: NEGATIVE
Comment: NEGATIVE
Comment: NEGATIVE
Comment: NEGATIVE
Comment: NEGATIVE
Comment: NORMAL
Neisseria Gonorrhea: NEGATIVE
Trichomonas: NEGATIVE

## 2021-10-01 LAB — HEPATITIS B SURFACE ANTIGEN: Hepatitis B Surface Ag: NEGATIVE

## 2021-10-01 LAB — HIV ANTIBODY (ROUTINE TESTING W REFLEX): HIV Screen 4th Generation wRfx: NONREACTIVE

## 2021-10-01 LAB — HEMOGLOBIN A1C
Est. average glucose Bld gHb Est-mCnc: 117 mg/dL
Hgb A1c MFr Bld: 5.7 % — ABNORMAL HIGH (ref 4.8–5.6)

## 2021-10-01 LAB — RPR: RPR Ser Ql: NONREACTIVE

## 2021-12-22 ENCOUNTER — Other Ambulatory Visit (HOSPITAL_COMMUNITY)
Admission: RE | Admit: 2021-12-22 | Discharge: 2021-12-22 | Disposition: A | Discharge status: Home / Self Care | Payer: Medicaid Other | Source: Ambulatory Visit | Attending: Family Medicine | Admitting: Family Medicine

## 2021-12-22 ENCOUNTER — Ambulatory Visit (INDEPENDENT_AMBULATORY_CARE_PROVIDER_SITE_OTHER): Payer: Medicaid Other

## 2021-12-22 VITALS — BP 118/70 | HR 75 | Wt 188.0 lb

## 2021-12-22 DIAGNOSIS — Z113 Encounter for screening for infections with a predominantly sexual mode of transmission: Secondary | ICD-10-CM | POA: Diagnosis present

## 2021-12-22 NOTE — Progress Notes (Signed)
Here today for STD screening. Pt reports abdominal cramping between menstrual periods, concerned this may be due to STD. Denies any abnormal vaginal discharge, odor, or irritation. Self swab instructions given and specimen obtained. Explained that we will contact pt with abnormal results. Encouraged pt to schedule provider visit if abdominal cramping continues.  ? Apolonio Schneiders RN ?12/22/21 ?

## 2021-12-23 LAB — CERVICOVAGINAL ANCILLARY ONLY
Chlamydia: NEGATIVE
Comment: NEGATIVE
Comment: NEGATIVE
Comment: NORMAL
Neisseria Gonorrhea: NEGATIVE
Trichomonas: NEGATIVE

## 2022-06-01 ENCOUNTER — Ambulatory Visit: Payer: Medicaid Other

## 2022-06-08 ENCOUNTER — Ambulatory Visit: Payer: Medicaid Other

## 2022-06-15 ENCOUNTER — Ambulatory Visit (INDEPENDENT_AMBULATORY_CARE_PROVIDER_SITE_OTHER): Payer: Medicaid Other | Admitting: *Deleted

## 2022-06-15 ENCOUNTER — Other Ambulatory Visit (HOSPITAL_COMMUNITY)
Admission: RE | Admit: 2022-06-15 | Discharge: 2022-06-15 | Disposition: A | Discharge status: Home / Self Care | Payer: Medicaid Other | Source: Ambulatory Visit | Attending: Family Medicine | Admitting: Family Medicine

## 2022-06-15 ENCOUNTER — Other Ambulatory Visit: Payer: Self-pay

## 2022-06-15 VITALS — BP 116/57 | HR 70 | Ht 66.0 in | Wt 175.6 lb

## 2022-06-15 DIAGNOSIS — Z202 Contact with and (suspected) exposure to infections with a predominantly sexual mode of transmission: Secondary | ICD-10-CM | POA: Diagnosis present

## 2022-06-15 DIAGNOSIS — N898 Other specified noninflammatory disorders of vagina: Secondary | ICD-10-CM | POA: Insufficient documentation

## 2022-06-15 NOTE — Progress Notes (Signed)
Agree with nurses's documentation of this patient's clinic encounter.  Jasslyn Finkel L, MD  

## 2022-06-15 NOTE — Progress Notes (Signed)
Here for nurse visit for self swab, states she wants to be checked to make sure all is ok. States she had some irritation but is better now, history of having BV. Would like metrogel not pills if BV is positive. Would like to be checked for all STD's and yeast. Instructions given for self swab and she collected self swab. I explained she will be contacted with treatment details if positive. She voices understanding. [name]

## 2022-06-16 LAB — CERVICOVAGINAL ANCILLARY ONLY
Bacterial Vaginitis (gardnerella): POSITIVE — AB
Candida Glabrata: NEGATIVE
Candida Vaginitis: NEGATIVE
Chlamydia: NEGATIVE
Comment: NEGATIVE
Comment: NEGATIVE
Comment: NEGATIVE
Comment: NEGATIVE
Comment: NEGATIVE
Comment: NORMAL
Neisseria Gonorrhea: NEGATIVE
Trichomonas: NEGATIVE

## 2022-06-17 ENCOUNTER — Telehealth: Payer: Self-pay

## 2022-06-17 MED ORDER — METRONIDAZOLE 0.75 % VA GEL: 1.0000 | Freq: Two times a day (BID) | VAGINAL | 0 refills | Status: DC

## 2022-06-17 NOTE — Telephone Encounter (Signed)
-----   Message from Chancy Milroy, MD sent at 06/16/2022 12:05 PM EDT ----- Please let pt know that her vaginal swab was positive for BV. Please send in Rx for Tx as per protocol.  Thanks Legrand Como

## 2022-06-17 NOTE — Telephone Encounter (Signed)
Call placed to pt. Spoke with pt. Pt given results and recommendations per Dr Rip Harbour. Pt verbalized understanding. Pt requested Rx Metrogel instead of Flagyl. Rx Metrogel sent to pharmacy on file. Pt agreeable to plan of care.  Colletta Maryland, RNC

## 2022-07-06 ENCOUNTER — Ambulatory Visit: Payer: Medicaid Other | Admitting: Student

## 2022-07-08 ENCOUNTER — Telehealth: Payer: Self-pay

## 2022-07-08 NOTE — Telephone Encounter (Signed)
Patient called nurse phone and states she needs help with Metrogel. Call routed to inbasket. Pt informed a nurse will call her.

## 2022-07-09 NOTE — Telephone Encounter (Signed)
I called Kristin Kline and she wanted to clarify how long and how many times a day she is to use the metrogel. I called her and clarified she is to use once a day at bedtime for 5 nights. She voices understanding. [name]

## 2022-11-18 ENCOUNTER — Ambulatory Visit (INDEPENDENT_AMBULATORY_CARE_PROVIDER_SITE_OTHER): Payer: Medicaid Other

## 2022-11-18 ENCOUNTER — Other Ambulatory Visit (HOSPITAL_COMMUNITY)
Admission: RE | Admit: 2022-11-18 | Discharge: 2022-11-18 | Disposition: A | Discharge status: Home / Self Care | Payer: Medicaid Other | Source: Ambulatory Visit | Attending: Family Medicine | Admitting: Family Medicine

## 2022-11-18 VITALS — BP 103/71 | HR 71 | Ht 66.0 in | Wt 179.9 lb

## 2022-11-18 DIAGNOSIS — Z202 Contact with and (suspected) exposure to infections with a predominantly sexual mode of transmission: Secondary | ICD-10-CM

## 2022-11-18 NOTE — Progress Notes (Cosign Needed Addendum)
Patient here today requesting full panel of STD testing--self swab and blood work included. She reports having unprotected sex about 1 week ago, and would just like STD testing as a precaution. Partner of patient states that they have not had any STDs, but patient would like to be extra safe.   Informed patient that we would test for GC/CT and trich with the self swab (as well as yeast and BV per patient request) and for blood work we would do HIV, RPR, and Hep B/C. Labs obtained. Informed patient that we would reach out via MyChart with results, and if treatment is needed we would send Rx's to pharm on file and reach out with next steps. Verified pharm with patient. Patient had no other questions or concerns.   Adonis Huguenin RN on 11/18/22 at 1018

## 2022-11-19 ENCOUNTER — Other Ambulatory Visit: Payer: Self-pay | Admitting: Obstetrics and Gynecology

## 2022-11-19 DIAGNOSIS — N760 Acute vaginitis: Secondary | ICD-10-CM

## 2022-11-19 LAB — CERVICOVAGINAL ANCILLARY ONLY
Bacterial Vaginitis (gardnerella): POSITIVE — AB
Candida Glabrata: NEGATIVE
Candida Vaginitis: NEGATIVE
Chlamydia: NEGATIVE
Comment: NEGATIVE
Comment: NEGATIVE
Comment: NEGATIVE
Comment: NEGATIVE
Comment: NEGATIVE
Comment: NORMAL
Neisseria Gonorrhea: NEGATIVE
Trichomonas: NEGATIVE

## 2022-11-19 LAB — HEPATITIS C ANTIBODY: Hep C Virus Ab: NONREACTIVE

## 2022-11-19 LAB — HIV ANTIBODY (ROUTINE TESTING W REFLEX): HIV Screen 4th Generation wRfx: NONREACTIVE

## 2022-11-19 LAB — RPR: RPR Ser Ql: NONREACTIVE

## 2022-11-19 LAB — HEPATITIS B SURFACE ANTIGEN: Hepatitis B Surface Ag: NEGATIVE

## 2022-11-19 MED ORDER — METRONIDAZOLE 0.75 % VA GEL: 1.0000 | Freq: Two times a day (BID) | VAGINAL | 0 refills | Status: DC

## 2023-01-04 ENCOUNTER — Ambulatory Visit: Payer: Medicaid Other

## 2023-01-11 ENCOUNTER — Other Ambulatory Visit (HOSPITAL_COMMUNITY)
Admission: RE | Admit: 2023-01-11 | Discharge: 2023-01-11 | Disposition: A | Discharge status: Home / Self Care | Payer: Medicaid Other | Source: Ambulatory Visit | Attending: Family Medicine | Admitting: Family Medicine

## 2023-01-11 ENCOUNTER — Ambulatory Visit (INDEPENDENT_AMBULATORY_CARE_PROVIDER_SITE_OTHER): Payer: Medicaid Other | Admitting: *Deleted

## 2023-01-11 ENCOUNTER — Other Ambulatory Visit: Payer: Self-pay

## 2023-01-11 VITALS — BP 129/75 | HR 77 | Ht 66.0 in | Wt 181.5 lb

## 2023-01-11 DIAGNOSIS — N898 Other specified noninflammatory disorders of vagina: Secondary | ICD-10-CM | POA: Insufficient documentation

## 2023-01-11 DIAGNOSIS — Z202 Contact with and (suspected) exposure to infections with a predominantly sexual mode of transmission: Secondary | ICD-10-CM | POA: Diagnosis not present

## 2023-01-11 NOTE — Progress Notes (Signed)
Here for self swab for std check. States she just wants to make sure all is well. Also requests HIV test. C/o  a little vaginal odor. Also wants to check for yeast and BV. Advised if anything positive she will be contacted. Advised it is time to schedule annual exam. She voices understanding. Kristin Kline

## 2023-01-12 ENCOUNTER — Telehealth: Payer: Self-pay | Admitting: General Practice

## 2023-01-12 DIAGNOSIS — A749 Chlamydial infection, unspecified: Secondary | ICD-10-CM

## 2023-01-12 DIAGNOSIS — B9689 Other specified bacterial agents as the cause of diseases classified elsewhere: Secondary | ICD-10-CM

## 2023-01-12 LAB — CERVICOVAGINAL ANCILLARY ONLY
Bacterial Vaginitis (gardnerella): POSITIVE — AB
Candida Glabrata: NEGATIVE
Candida Vaginitis: NEGATIVE
Chlamydia: POSITIVE — AB
Comment: NEGATIVE
Comment: NEGATIVE
Comment: NEGATIVE
Comment: NEGATIVE
Comment: NEGATIVE
Comment: NORMAL
Neisseria Gonorrhea: NEGATIVE
Trichomonas: NEGATIVE

## 2023-01-12 LAB — HIV ANTIBODY (ROUTINE TESTING W REFLEX): HIV Screen 4th Generation wRfx: NONREACTIVE

## 2023-01-12 MED ORDER — METRONIDAZOLE 0.75 % VA GEL: 1.0000 | Freq: Every day | VAGINAL | 0 refills | Status: AC

## 2023-01-12 MED ORDER — DOXYCYCLINE HYCLATE 100 MG PO CAPS: 100.0000 mg | ORAL_CAPSULE | Freq: Two times a day (BID) | ORAL | 0 refills | Status: AC

## 2023-01-12 NOTE — Telephone Encounter (Signed)
Called patient regarding test results. Reviewed instructions for medications sent to pharmacy, partner treatment options, & abstinence x 2 weeks. Patient verbalized understanding. STD card completed

## 2023-01-14 ENCOUNTER — Telehealth: Payer: Self-pay | Admitting: Family Medicine

## 2023-01-14 NOTE — Telephone Encounter (Signed)
Returned call to patient. She reports she took her Medicine for Chlamydia treatment and threw up about 10 minutes later, she is not sure she saw the Medication in the vomitus. This is the first dose that she has thrown up, reviewed continuing medication as prescribed. Reviewed can come in for retesting in 3 months, she reports she has an annual exam in mid June, reviewed she can speak with Physician and see if too early to retest or if another test needs to be scheduled. Patient voiced understanding.

## 2023-01-14 NOTE — Telephone Encounter (Signed)
Patient would like to discuss medication with a nurse

## 2023-01-21 ENCOUNTER — Telehealth: Payer: Self-pay | Admitting: *Deleted

## 2023-01-21 DIAGNOSIS — A749 Chlamydial infection, unspecified: Secondary | ICD-10-CM

## 2023-01-21 MED ORDER — DOXYCYCLINE HYCLATE 100 MG PO CAPS: 100.0000 mg | ORAL_CAPSULE | Freq: Two times a day (BID) | ORAL | 0 refills | Status: AC

## 2023-01-21 NOTE — Telephone Encounter (Signed)
Kaliegh called and reported she had intercourse before she completed treatment for Chlamydia and that her partner has not been treated yet.states she wants to know if she can be retreated for chlamydia and BV. We discussed her partner needs to be treated and they should not have intercourse until both have completed treatment, then wait 2 weeks. She voices understanding and states it was her mistake to have intercourse too early. I explained I will discuss with provider and if needed will send in RX to pharmacy, if not needed, I will call her back. She voices understanding. Nancy Fetter

## 2023-01-21 NOTE — Addendum Note (Signed)
Addended by: Gerome Apley on: 01/21/2023 09:43 AM   Modules accepted: Orders

## 2023-01-21 NOTE — Telephone Encounter (Signed)
I discussed with Dr. Briscoe Deutscher and she approved retreatment for Chlamydia only.  New rx sent in . Nancy Fetter

## 2023-02-23 NOTE — Progress Notes (Unsigned)
ANNUAL EXAM Patient name: Kristin Kline MRN [id number]  Date of birth: 12-07-1990 Chief Complaint:   No chief complaint on file.  History of Present Illness:   Kristin Kline is a 32 y.o. G92P1001 {race:25618} female being seen today for a routine annual exam.  Current complaints: ***  No LMP recorded.   The pregnancy intention screening data noted above was reviewed. Potential methods of contraception were discussed. The patient elected to proceed with No data recorded.   Last pap ***. Results were: {Pap findings:25134}. H/O abnormal pap: {yes/yes***/no:23866} Last mammogram: ***. Results were: {normal, abnormal, n/a:23837}. Family h/o breast cancer: {yes***/no:23838} Last colonoscopy: ***. Results were: {normal, abnormal, n/a:23837}. Family h/o colorectal cancer: {yes***/no:23838}     12/22/2021    8:35 AM 09/30/2021   10:25 AM 02/26/2021   11:28 AM 11/07/2020   10:22 AM 10/23/2020   11:07 AM  Depression screen PHQ 2/9  Decreased Interest 0 1 0 0 0  Down, Depressed, Hopeless 0 1 1 0 0  PHQ - 2 Score 0 2 1 0 0  Altered sleeping 1 2 1  0 0  Tired, decreased energy 1 0 1 0 0  Change in appetite 0 1 0 0 0  Feeling bad or failure about yourself  0  0 0 0  Trouble concentrating 0 0 0 0 0  Moving slowly or fidgety/restless 0 0 0 0 0  Suicidal thoughts 0 0 0 0 0  PHQ-9 Score 2 5 3  0 0        12/22/2021    8:36 AM 09/30/2021   10:25 AM 02/26/2021   11:28 AM 11/07/2020   10:23 AM  GAD 7 : Generalized Anxiety Score  Nervous, Anxious, on Edge 0 0 0 0  Control/stop worrying 0 1 0 0  Worry too much - different things 1 1 0 1  Trouble relaxing 0 1 0 1  Restless 0 0 0 0  Easily annoyed or irritable 0 1 0 0  Afraid - awful might happen 0 0 0 0  Total GAD 7 Score 1 4 0 2     Review of Systems:   Pertinent items are noted in HPI Denies any headaches, blurred vision, fatigue, shortness of breath, chest pain, abdominal pain, abnormal vaginal discharge/itching/odor/irritation,  problems with periods, bowel movements, urination, or intercourse unless otherwise stated above. Pertinent History Reviewed:  Reviewed past medical,surgical, social and family history.  Reviewed problem list, medications and allergies. Physical Assessment:  There were no vitals filed for this visit. There is no height or weight on file to calculate BMI.        Physical Examination:   General appearance - well appearing, and in no distress  Mental status - alert, oriented to person, place, and time  Psych:  She has a normal mood and affect  Skin - warm and dry, normal color, no suspicious lesions noted  Chest - effort normal, all lung fields clear to auscultation bilaterally  Heart - normal rate and regular rhythm  Neck:  midline trachea, no thyromegaly or nodules  Breasts - breasts appear normal, no suspicious masses, no skin or nipple changes or  axillary nodes  Abdomen - soft, nontender, nondistended, no masses or organomegaly  Pelvic - VULVA: normal appearing vulva with no masses, tenderness or lesions  VAGINA: normal appearing vagina with normal color and discharge, no lesions  CERVIX: normal appearing cervix without discharge or lesions, no CMT  Thin prep pap is {Desc; done/not:10129} *** HR HPV  cotesting  UTERUS: uterus is felt to be normal size, shape, consistency and nontender   ADNEXA: No adnexal masses or tenderness noted.  Rectal - normal rectal, good sphincter tone, no masses felt. Hemoccult: ***  Extremities:  No swelling or varicosities noted  Chaperone present for exam  No results found for this or any previous visit (from the past 24 hour(s)).  Assessment & Plan:      1. Encounter for annual routine gynecological examination ***  2. History of cervical dysplasia ***  Will follow up results of pap smear and manage accordingly. Mammogram scheduled Colon cancer screening is up to date***Referral made to Gastroenterology for colonoscopy***discussed Cologuard vs  Colonoscopy details and patient will decide and let us know her decision. Routine preventative health maintenance measures emphasized. Please refer to After Visit Summary for other counseling recommendations.       Mammogram: {Mammo f/u:25212::"@ 30yo"}, or sooner if problems Colonoscopy: {TCS f/u:25213::"@ 30yo"}, or sooner if problems  No orders of the defined types were placed in this encounter.   Meds: No orders of the defined types were placed in this encounter.   Follow-up: No follow-ups on file.  Bernerd Limbo, CNM 02/23/2023 8:32 PM

## 2023-02-24 ENCOUNTER — Other Ambulatory Visit (HOSPITAL_COMMUNITY)
Admission: RE | Admit: 2023-02-24 | Discharge: 2023-02-24 | Disposition: A | Discharge status: Home / Self Care | Payer: Medicaid Other | Source: Ambulatory Visit | Attending: Certified Nurse Midwife | Admitting: Certified Nurse Midwife

## 2023-02-24 ENCOUNTER — Encounter: Payer: Self-pay | Admitting: Certified Nurse Midwife

## 2023-02-24 ENCOUNTER — Ambulatory Visit (INDEPENDENT_AMBULATORY_CARE_PROVIDER_SITE_OTHER): Payer: Medicaid Other | Admitting: Certified Nurse Midwife

## 2023-02-24 ENCOUNTER — Other Ambulatory Visit: Payer: Self-pay

## 2023-02-24 VITALS — BP 116/76 | HR 71 | Wt 184.8 lb

## 2023-02-24 DIAGNOSIS — Z113 Encounter for screening for infections with a predominantly sexual mode of transmission: Secondary | ICD-10-CM

## 2023-02-24 DIAGNOSIS — Z01419 Encounter for gynecological examination (general) (routine) without abnormal findings: Secondary | ICD-10-CM | POA: Insufficient documentation

## 2023-02-24 DIAGNOSIS — Z87410 Personal history of cervical dysplasia: Secondary | ICD-10-CM

## 2023-02-24 DIAGNOSIS — Z3A09 9 weeks gestation of pregnancy: Secondary | ICD-10-CM | POA: Diagnosis not present

## 2023-02-24 DIAGNOSIS — Z3490 Encounter for supervision of normal pregnancy, unspecified, unspecified trimester: Secondary | ICD-10-CM | POA: Insufficient documentation

## 2023-02-25 LAB — CBC
Hematocrit: 37.7 % (ref 34.0–46.6)
Hemoglobin: 12.7 g/dL (ref 11.1–15.9)
MCH: 31.3 pg (ref 26.6–33.0)
MCHC: 33.7 g/dL (ref 31.5–35.7)
MCV: 93 fL (ref 79–97)
Platelets: 273 10*3/uL (ref 150–450)
RBC: 4.06 x10E6/uL (ref 3.77–5.28)
RDW: 12.8 % (ref 11.7–15.4)
WBC: 7.5 10*3/uL (ref 3.4–10.8)

## 2023-02-25 LAB — CERVICOVAGINAL ANCILLARY ONLY
Bacterial Vaginitis (gardnerella): NEGATIVE
Candida Glabrata: NEGATIVE
Candida Vaginitis: POSITIVE — AB
Comment: NEGATIVE
Comment: NEGATIVE
Comment: NEGATIVE

## 2023-02-25 LAB — HEPATITIS B SURFACE ANTIGEN: Hepatitis B Surface Ag: NEGATIVE

## 2023-02-25 LAB — HIV ANTIBODY (ROUTINE TESTING W REFLEX): HIV Screen 4th Generation wRfx: NONREACTIVE

## 2023-02-25 LAB — HEPATITIS C ANTIBODY: Hep C Virus Ab: NONREACTIVE

## 2023-02-25 LAB — RPR: RPR Ser Ql: NONREACTIVE

## 2023-02-26 MED ORDER — FLUCONAZOLE 150 MG PO TABS: 150.0000 mg | ORAL_TABLET | Freq: Every day | ORAL | 1 refills | Status: DC

## 2023-02-26 NOTE — Addendum Note (Signed)
Addended by: Edd Arbour on: 02/26/2023 09:19 AM   Modules accepted: Orders

## 2023-03-02 LAB — CYTOLOGY - PAP
Chlamydia: NEGATIVE
Comment: NEGATIVE
Comment: NEGATIVE
Comment: NEGATIVE
Comment: NEGATIVE
Comment: NORMAL
Diagnosis: NEGATIVE
HPV 16: NEGATIVE
HPV 18 / 45: NEGATIVE
High risk HPV: POSITIVE — AB
Neisseria Gonorrhea: NEGATIVE

## 2023-04-16 ENCOUNTER — Telehealth: Payer: Self-pay | Admitting: Family Medicine

## 2023-04-16 NOTE — Telephone Encounter (Signed)
Patient want to know what's the time length to get retested for STD,  she is scheduled for next Thursday

## 2023-04-16 NOTE — Telephone Encounter (Signed)
Called patient at number listed in chart--verified with full name and DOB.   Patient stated that she had had sex yesterday and that the condom "unravelled" some as her partner was pulling out his penis, but that the condom did not fully come off. Patient questioned if this would affect her STD testing results on Thursday 04/22/23.   Informed patient that it shouldn't affect STD testing next week, but that I can't 100% say it wont; however, ensured patient that it is good that she's coming in for testing in general just in case. Patient asked if she should abstain from sex until after she gets her testing done; encouraged patient to do so, and stated that if she still decides to have sex between now and then, to be extra vigilant in using protection. Patient verbalized understanding and had no other questions or concerns.   Maureen Ralphs RN on 04/16/23 at 1031

## 2023-04-19 ENCOUNTER — Ambulatory Visit: Payer: Medicaid Other

## 2023-04-22 ENCOUNTER — Ambulatory Visit (INDEPENDENT_AMBULATORY_CARE_PROVIDER_SITE_OTHER): Payer: Medicaid Other | Admitting: General Practice

## 2023-04-22 ENCOUNTER — Other Ambulatory Visit (HOSPITAL_COMMUNITY)
Admission: RE | Admit: 2023-04-22 | Discharge: 2023-04-22 | Disposition: A | Discharge status: Home / Self Care | Payer: Medicaid Other | Source: Ambulatory Visit | Attending: Family Medicine | Admitting: Family Medicine

## 2023-04-22 ENCOUNTER — Encounter: Payer: Self-pay | Admitting: General Practice

## 2023-04-22 VITALS — BP 120/78 | HR 62 | Ht 66.0 in | Wt 185.0 lb

## 2023-04-22 DIAGNOSIS — Z113 Encounter for screening for infections with a predominantly sexual mode of transmission: Secondary | ICD-10-CM | POA: Insufficient documentation

## 2023-04-22 NOTE — Progress Notes (Signed)
Patient presents to office today requesting STD screening with HIV & RPR. She reports lower abdominal/pelvic cramping since last weekend and just wants to be on the safe side. Patient instructed in self swab & specimen collected. Blood work also collected. Discussed results will be back in 24-48 hours and available via mychart.   Chase Caller RN BSN 04/22/23

## 2023-04-23 LAB — CERVICOVAGINAL ANCILLARY ONLY
Chlamydia: NEGATIVE
Comment: NEGATIVE
Comment: NEGATIVE
Comment: NORMAL
Neisseria Gonorrhea: NEGATIVE
Trichomonas: NEGATIVE

## 2023-04-23 LAB — HIV ANTIBODY (ROUTINE TESTING W REFLEX): HIV Screen 4th Generation wRfx: NONREACTIVE

## 2023-04-23 LAB — RPR: RPR Ser Ql: NONREACTIVE

## 2023-06-16 ENCOUNTER — Telehealth: Payer: Self-pay | Admitting: Family Medicine

## 2023-06-16 NOTE — Telephone Encounter (Signed)
Patient state she has never had a issue with her cycle, but this time after she went off she started bleeding again, want to know if this is normal

## 2023-06-18 NOTE — Telephone Encounter (Signed)
Reports abnormal menstrual period. Recent medication abortion at end of July with bleeding from 7/28-8/1. She is concerned because her period ended 05/31/23 and began again on 10/8. This was earlier than expected. Reviewed it can take a few months for period to normalize following any pregnancy. Pt did not have follow up after abortion and is mildly concerned she may still be pregnant or have a new pregnancy; recommended home UPT for reassurance. Offered provider visit for birth control or to discuss irregular periods if this continues over the next few months.

## 2023-11-16 NOTE — Progress Notes (Unsigned)
 ANNUAL EXAM Patient name: Kristin Kline MRN [id number]  Date of birth: 12-19-1990 Chief Complaint:   No chief complaint on file.  History of Present Illness:   Kristin Kline is a 33 y.o. G2P1001 {race:25618} female being seen today for a routine annual exam.  Current complaints: ***  No LMP recorded.   The pregnancy intention screening data noted above was reviewed. Potential methods of contraception were discussed. The patient elected to proceed with No data recorded.   Last pap ***. Results were: {Pap findings:25134}. H/O abnormal pap: {yes/yes***/no:23866} Last mammogram: ***. Results were: {normal, abnormal, n/a:23837}. Family h/o breast cancer: {yes***/no:23838} Last colonoscopy: ***. Results were: {normal, abnormal, n/a:23837}. Family h/o colorectal cancer: {yes***/no:23838}     02/24/2023    9:02 AM 12/22/2021    8:35 AM 09/30/2021   10:25 AM 02/26/2021   11:28 AM 11/07/2020   10:22 AM  Depression screen PHQ 2/9  Decreased Interest 0 0 1 0 0  Down, Depressed, Hopeless 0 0 1 1 0  PHQ - 2 Score 0 0 2 1 0  Altered sleeping 0 1 2 1  0  Tired, decreased energy 1 1 0 1 0  Change in appetite 0 0 1 0 0  Feeling bad or failure about yourself  0 0  0 0  Trouble concentrating 0 0 0 0 0  Moving slowly or fidgety/restless 0 0 0 0 0  Suicidal thoughts 0 0 0 0 0  PHQ-9 Score 1 2 5 3  0        02/24/2023    9:03 AM 12/22/2021    8:36 AM 09/30/2021   10:25 AM 02/26/2021   11:28 AM  GAD 7 : Generalized Anxiety Score  Nervous, Anxious, on Edge 0 0 0 0  Control/stop worrying 0 0 1 0  Worry too much - different things 1 1 1  0  Trouble relaxing 1 0 1 0  Restless 0 0 0 0  Easily annoyed or irritable 1 0 1 0  Afraid - awful might happen 0 0 0 0  Total GAD 7 Score 3 1 4  0     Review of Systems:   Pertinent items are noted in HPI Denies any headaches, blurred vision, fatigue, shortness of breath, chest pain, abdominal pain, abnormal vaginal discharge/itching/odor/irritation,  problems with periods, bowel movements, urination, or intercourse unless otherwise stated above.  Pertinent History Reviewed:  Reviewed past medical,surgical, social and family history.  Reviewed problem list, medications and allergies.  Physical Assessment:  There were no vitals filed for this visit. There is no height or weight on file to calculate BMI.   Physical Examination:  General appearance - well appearing, and in no distress Mental status - alert, oriented to person, place, and time Psych:  She has a normal mood and affect Skin - warm and dry, normal color, no suspicious lesions noted Chest - effort normal, no problems with respiration noted Heart - normal rate and regular rhythm Neck:  midline trachea, no thyromegaly or nodules Breasts - breasts appear normal, no suspicious masses, no skin or nipple changes or  axillary nodes Abdomen - soft, nontender, nondistended, no masses or organomegaly Pelvic - VULVA: normal appearing vulva with no masses, tenderness or lesions   VAGINA: normal appearing vagina with normal color and discharge, no lesions   CERVIX: normal appearing cervix without discharge or lesions, no CMT Thin prep pap is {Desc; done/not:10129} *** HR HPV cotesting Extremities:  No swelling or varicosities noted  Chaperone present for  exam  No results found for this or any previous visit (from the past 24 hours).  Assessment & Plan:      1. Encounter for annual routine gynecological examination (Primary) ***  - Will follow up results of pap smear and manage accordingly. - Mammogram scheduled - Colon cancer screening is up to date***Referral made to Gastroenterology for colonoscopy***discussed Cologuard vs Colonoscopy details and patient will decide and let us know her decision. - Routine preventative health maintenance measures emphasized. - Please refer to After Visit Summary for other counseling recommendations.       Mammogram: {Mammo f/u:25212::"@ 31yo"},  or sooner if problems Colonoscopy: {TCS f/u:25213::"@ 31yo"}, or sooner if problems  No orders of the defined types were placed in this encounter.   Meds: No orders of the defined types were placed in this encounter.   Follow-up: No follow-ups on file.  Bernerd Limbo, CNM 11/16/2023 9:34 AM

## 2023-11-17 ENCOUNTER — Other Ambulatory Visit: Payer: Self-pay

## 2023-11-17 ENCOUNTER — Encounter: Payer: Self-pay | Admitting: Certified Nurse Midwife

## 2023-11-17 ENCOUNTER — Ambulatory Visit: Payer: Medicaid Other | Admitting: Certified Nurse Midwife

## 2023-11-17 ENCOUNTER — Other Ambulatory Visit (HOSPITAL_COMMUNITY)
Admission: RE | Admit: 2023-11-17 | Discharge: 2023-11-17 | Disposition: A | Discharge status: Home / Self Care | Source: Ambulatory Visit | Attending: Certified Nurse Midwife | Admitting: Certified Nurse Midwife

## 2023-11-17 VITALS — BP 117/81 | HR 67 | Wt 193.8 lb

## 2023-11-17 DIAGNOSIS — Z113 Encounter for screening for infections with a predominantly sexual mode of transmission: Secondary | ICD-10-CM | POA: Diagnosis not present

## 2023-11-17 DIAGNOSIS — Z1331 Encounter for screening for depression: Secondary | ICD-10-CM | POA: Diagnosis not present

## 2023-11-17 DIAGNOSIS — Z01419 Encounter for gynecological examination (general) (routine) without abnormal findings: Secondary | ICD-10-CM

## 2023-11-17 NOTE — Progress Notes (Signed)
 History:  Ms. Kristin Kline is a 33 y.o. G2P1001 who presents to clinic today for STI screening. No complaints, pap up to date and periods regular about every 22-28 days.   The following portions of the patient's history were reviewed and updated as appropriate: allergies, current medications, family history, past medical history, social history, past surgical history and problem list.  Review of Systems:  Pertinent items noted in HPI and remainder of comprehensive ROS otherwise negative.   Objective:  Physical Exam BP 117/81   Pulse 67   Wt 87.9 kg   LMP 11/09/2023 (Exact Date)   Breastfeeding No   BMI 31.28 kg/m  Physical Exam Constitutional:      Appearance: Normal appearance.  Cardiovascular:     Rate and Rhythm: Normal rate and regular rhythm.  Pulmonary:     Effort: Pulmonary effort is normal.  Psychiatric:        Mood and Affect: Mood normal.    Labs and Imaging No results found for this or any previous visit (from the past 24 hours).  No results found.   Assessment & Plan:  1. Screening examination for STD (sexually transmitted disease) (Primary)  - RPR+HBsAg+HCVAb+... - Cervicovaginal ancillary only( [place])  Follow up PRN  Elige Ko, Student-MidWife 11/17/2023 12:51 PM

## 2023-11-18 LAB — CERVICOVAGINAL ANCILLARY ONLY
Chlamydia: NEGATIVE
Comment: NEGATIVE
Comment: NEGATIVE
Comment: NORMAL
Neisseria Gonorrhea: NEGATIVE
Trichomonas: NEGATIVE

## 2023-11-18 LAB — RPR+HBSAG+HCVAB+...
HIV Screen 4th Generation wRfx: NONREACTIVE
Hep C Virus Ab: NONREACTIVE
Hepatitis B Surface Ag: NEGATIVE
RPR Ser Ql: NONREACTIVE

## 2024-02-16 ENCOUNTER — Ambulatory Visit: Admitting: Certified Nurse Midwife

## 2024-02-23 ENCOUNTER — Ambulatory Visit: Admitting: Certified Nurse Midwife

## 2024-02-23 ENCOUNTER — Other Ambulatory Visit: Payer: Self-pay

## 2024-02-23 ENCOUNTER — Encounter: Payer: Self-pay | Admitting: Certified Nurse Midwife

## 2024-02-23 ENCOUNTER — Other Ambulatory Visit (HOSPITAL_COMMUNITY)
Admission: RE | Admit: 2024-02-23 | Discharge: 2024-02-23 | Disposition: A | Discharge status: Home / Self Care | Source: Ambulatory Visit | Attending: Certified Nurse Midwife | Admitting: Certified Nurse Midwife

## 2024-02-23 VITALS — BP 103/74 | HR 77 | Wt 200.1 lb

## 2024-02-23 DIAGNOSIS — Z113 Encounter for screening for infections with a predominantly sexual mode of transmission: Secondary | ICD-10-CM | POA: Diagnosis not present

## 2024-02-23 DIAGNOSIS — Z1331 Encounter for screening for depression: Secondary | ICD-10-CM | POA: Diagnosis not present

## 2024-02-23 DIAGNOSIS — Z01419 Encounter for gynecological examination (general) (routine) without abnormal findings: Secondary | ICD-10-CM | POA: Insufficient documentation

## 2024-02-23 DIAGNOSIS — R635 Abnormal weight gain: Secondary | ICD-10-CM

## 2024-02-23 NOTE — Progress Notes (Signed)
 History:  Ms. MARYLAND STELL is a 33 y.o. G2P1001 who presents to clinic today for annual exam with pap smear and STI screening. She is also concerned about recent weight gain over the last 6 months. Denies any changes to her diet. She typically eats one large meal daily. Has noticed more bloating even after her period has ended. Nothing seems to make it better or worse. Reports regular bowel movements.   The following portions of the patient's history were reviewed and updated as appropriate: allergies, current medications, family history, past medical history, social history, past surgical history and problem list.  Review of Systems:  Pertinent items noted in HPI and remainder of comprehensive ROS otherwise negative.    Objective:  Physical Exam BP 103/74   Pulse 77   Wt 90.8 kg   LMP 02/17/2024 (Exact Date)   BMI 32.30 kg/m  Physical Exam Vitals reviewed. Exam conducted with a chaperone present.  HENT:     Head: Normocephalic.   Cardiovascular:     Rate and Rhythm: Normal rate.  Pulmonary:     Effort: Pulmonary effort is normal.  Abdominal:     General: Abdomen is flat.     Palpations: Abdomen is soft.  Genitourinary:    General: Normal vulva.     Vagina: Normal.     Cervix: Normal.     Uterus: Normal.      Adnexa: Right adnexa normal and left adnexa normal.   Neurological:     Mental Status: She is alert and oriented to person, place, and time.   Psychiatric:        Mood and Affect: Mood normal.      Labs and Imaging No results found for this or any previous visit (from the past 24 hours).  No results found.   Assessment & Plan:  1. Screening examination for STD (sexually transmitted disease) (Primary) - RPR+HBsAg+HCVAb+... - Cervicovaginal ancillary only( [place])  2. Encounter for annual routine gynecological examination - Cytology - PAP( [place]) - Vitamin D (25 hydroxy) - Lipid Panel w/o Chol/HDL Ratio  3. Recent weight gain -  Discussed ideally eating every 3 hours (3 meals with healthy snacks in between). Focus on protein and plant foods. Increase water intake and add a physical activity daily like walking or strength training.  - HgB A1c - Thyroid Panel With TSH   Wilford Hanks, Student-MidWife 02/23/2024 11:59 AM

## 2024-02-24 LAB — LIPID PANEL W/O CHOL/HDL RATIO
Cholesterol, Total: 185 mg/dL (ref 100–199)
HDL: 47 mg/dL (ref >39)
LDL Chol Calc (NIH): 116 mg/dL — ABNORMAL HIGH (ref 0–99)
Triglycerides: 120 mg/dL (ref 0–149)
VLDL Cholesterol Cal: 22 mg/dL (ref 5–40)

## 2024-02-24 LAB — VITAMIN D 25 HYDROXY (VIT D DEFICIENCY, FRACTURES): Vit D, 25-Hydroxy: 15.2 ng/mL — ABNORMAL LOW (ref 30.0–100.0)

## 2024-02-24 LAB — THYROID PANEL WITH TSH
Free Thyroxine Index: 1.9 (ref 1.2–4.9)
T3 Uptake Ratio: 30 % (ref 24–39)
T4, Total: 6.3 ug/dL (ref 4.5–12.0)
TSH: 1.06 u[IU]/mL (ref 0.450–4.500)

## 2024-02-24 LAB — CERVICOVAGINAL ANCILLARY ONLY
Chlamydia: NEGATIVE
Comment: NEGATIVE
Comment: NEGATIVE
Comment: NORMAL
Neisseria Gonorrhea: NEGATIVE
Trichomonas: NEGATIVE

## 2024-02-24 LAB — HEMOGLOBIN A1C
Est. average glucose Bld gHb Est-mCnc: 108 mg/dL
Hgb A1c MFr Bld: 5.4 % (ref 4.8–5.6)

## 2024-02-24 LAB — RPR+HBSAG+HCVAB+...
HIV Screen 4th Generation wRfx: NONREACTIVE
Hep C Virus Ab: NONREACTIVE
Hepatitis B Surface Ag: NEGATIVE
RPR Ser Ql: NONREACTIVE

## 2024-02-25 ENCOUNTER — Ambulatory Visit: Payer: Self-pay | Admitting: Certified Nurse Midwife

## 2024-02-25 DIAGNOSIS — E559 Vitamin D deficiency, unspecified: Secondary | ICD-10-CM

## 2024-02-25 MED ORDER — VITAMIN D (ERGOCALCIFEROL) 1.25 MG (50000 UNIT) PO CAPS: 50000.0000 [IU] | ORAL_CAPSULE | ORAL | 0 refills | Status: AC

## 2024-02-29 LAB — CYTOLOGY - PAP
Comment: NEGATIVE
Comment: NEGATIVE
Comment: NEGATIVE
Diagnosis: UNDETERMINED — AB
HPV 16: NEGATIVE
HPV 18 / 45: NEGATIVE
High risk HPV: POSITIVE — AB

## 2024-04-03 ENCOUNTER — Telehealth: Payer: Self-pay | Admitting: Family Medicine

## 2024-04-03 ENCOUNTER — Ambulatory Visit

## 2024-04-03 NOTE — Telephone Encounter (Signed)
 Returned call to patient and confirmed identity. Patient wanted to know how long she needed to wait after intercourse to get STD testing. After speaking to provider I informed patient that she could come in immediatly for a swab if she felt as if she was exposed to a STD. Patient verified understanding and is scheduled for a nurse visit 04/05/2024. Patient had no further questions.   B'Aisha, RMA

## 2024-04-03 NOTE — Telephone Encounter (Signed)
 Patient called to reschedule her nurse visit. She would like to know how long it will take her to get her test results after having std screening.

## 2024-04-05 ENCOUNTER — Ambulatory Visit

## 2024-04-13 ENCOUNTER — Ambulatory Visit: Admitting: Obstetrics and Gynecology

## 2024-04-19 ENCOUNTER — Other Ambulatory Visit (HOSPITAL_COMMUNITY)
Admission: RE | Admit: 2024-04-19 | Discharge: 2024-04-19 | Disposition: A | Discharge status: Home / Self Care | Source: Ambulatory Visit | Attending: Family Medicine | Admitting: Family Medicine

## 2024-04-19 ENCOUNTER — Ambulatory Visit (INDEPENDENT_AMBULATORY_CARE_PROVIDER_SITE_OTHER): Admitting: *Deleted

## 2024-04-19 ENCOUNTER — Other Ambulatory Visit: Payer: Self-pay

## 2024-04-19 VITALS — BP 101/79 | HR 68 | Ht 66.0 in | Wt 192.8 lb

## 2024-04-19 DIAGNOSIS — Z202 Contact with and (suspected) exposure to infections with a predominantly sexual mode of transmission: Secondary | ICD-10-CM | POA: Diagnosis present

## 2024-04-19 NOTE — Progress Notes (Signed)
 Here to do self swab. States she just wants to check because she is worried she was exposed to trich or other Sunday. She denies any symptoms and we disucssed it may be too soon to show for some std's. She states her annual is in September and she will test again if any symptoms. Self swab obtained and explained she will be contacted if results positive. She voices understanding. Rock Skip PEAK

## 2024-04-20 LAB — CERVICOVAGINAL ANCILLARY ONLY
Bacterial Vaginitis (gardnerella): POSITIVE — AB
Candida Glabrata: NEGATIVE
Candida Vaginitis: POSITIVE — AB
Chlamydia: NEGATIVE
Comment: NEGATIVE
Comment: NEGATIVE
Comment: NEGATIVE
Comment: NEGATIVE
Comment: NEGATIVE
Comment: NORMAL
Neisseria Gonorrhea: NEGATIVE
Trichomonas: NEGATIVE

## 2024-04-24 ENCOUNTER — Other Ambulatory Visit: Payer: Self-pay | Admitting: *Deleted

## 2024-04-24 ENCOUNTER — Ambulatory Visit: Payer: Self-pay | Admitting: Obstetrics and Gynecology

## 2024-04-24 DIAGNOSIS — B9689 Other specified bacterial agents as the cause of diseases classified elsewhere: Secondary | ICD-10-CM

## 2024-04-24 MED ORDER — FLUCONAZOLE 150 MG PO TABS: 150.0000 mg | ORAL_TABLET | Freq: Once | ORAL | 0 refills | Status: AC

## 2024-04-24 MED ORDER — METRONIDAZOLE 500 MG PO TABS: 500.0000 mg | ORAL_TABLET | Freq: Two times a day (BID) | ORAL | 0 refills | Status: DC

## 2024-04-24 MED ORDER — METRONIDAZOLE 0.75 % VA GEL: 1.0000 | Freq: Every day | VAGINAL | 0 refills | Status: AC

## 2024-05-29 ENCOUNTER — Other Ambulatory Visit: Payer: Self-pay

## 2024-05-29 ENCOUNTER — Ambulatory Visit: Admitting: Obstetrics and Gynecology

## 2024-05-29 ENCOUNTER — Other Ambulatory Visit (HOSPITAL_COMMUNITY)
Admission: RE | Admit: 2024-05-29 | Discharge: 2024-05-29 | Disposition: A | Discharge status: Home / Self Care | Source: Ambulatory Visit | Attending: Obstetrics and Gynecology | Admitting: Obstetrics and Gynecology

## 2024-05-29 ENCOUNTER — Ambulatory Visit: Payer: Self-pay | Admitting: Obstetrics and Gynecology

## 2024-05-29 VITALS — BP 123/82 | HR 67 | Wt 196.6 lb

## 2024-05-29 DIAGNOSIS — R87610 Atypical squamous cells of undetermined significance on cytologic smear of cervix (ASC-US): Secondary | ICD-10-CM

## 2024-05-29 DIAGNOSIS — R87810 Cervical high risk human papillomavirus (HPV) DNA test positive: Secondary | ICD-10-CM | POA: Diagnosis not present

## 2024-05-29 DIAGNOSIS — Z113 Encounter for screening for infections with a predominantly sexual mode of transmission: Secondary | ICD-10-CM | POA: Diagnosis present

## 2024-05-29 DIAGNOSIS — Z3202 Encounter for pregnancy test, result negative: Secondary | ICD-10-CM

## 2024-05-29 DIAGNOSIS — N871 Moderate cervical dysplasia: Secondary | ICD-10-CM | POA: Diagnosis not present

## 2024-05-29 DIAGNOSIS — B9689 Other specified bacterial agents as the cause of diseases classified elsewhere: Secondary | ICD-10-CM

## 2024-05-29 LAB — CERVICOVAGINAL ANCILLARY ONLY
Bacterial Vaginitis (gardnerella): POSITIVE — AB
Candida Glabrata: NEGATIVE
Candida Vaginitis: NEGATIVE
Chlamydia: NEGATIVE
Comment: NEGATIVE
Comment: NEGATIVE
Comment: NEGATIVE
Comment: NEGATIVE
Comment: NEGATIVE
Comment: NORMAL
Neisseria Gonorrhea: NEGATIVE
Trichomonas: NEGATIVE

## 2024-05-29 LAB — POCT PREGNANCY, URINE: Preg Test, Ur: NEGATIVE

## 2024-05-29 NOTE — Addendum Note (Signed)
 Addended by: GEOFFREY DEVON SAUNDERS on: 05/29/2024 09:15 AM   Modules accepted: Orders

## 2024-05-29 NOTE — Progress Notes (Signed)
    GYNECOLOGY OFFICE COLPOSCOPY PROCEDURE NOTE  33 y.o. G2P1001 here for colposcopy for ASCUS with POSITIVE high risk HPV pap smear on 02/23/24. Discussed role for HPV in cervical dysplasia, need for surveillance.  Patient gave informed written consent, time out was performed.  Placed in lithotomy position. Cervix viewed with speculum and colposcope after application of acetic acid.   Colposcopy adequate? Yes  no visible lesions; corresponding biopsies obtained.  ECC specimen obtained. All specimens were labeled and sent to pathology.  Chaperone was present during entire procedure.  Patient was given post procedure instructions.  Will follow up pathology and manage accordingly; patient will be contacted with results and recommendations.  Routine preventative health maintenance measures emphasized.   Carter Quarry, MD, FACOG Minimally Invasive Gynecologic Surgery  Obstetrics and Gynecology, [facility], [facility] 05/29/2024

## 2024-05-30 MED ORDER — METRONIDAZOLE 0.75 % VA GEL: 1.0000 | Freq: Every day | VAGINAL | 0 refills | Status: AC

## 2024-05-31 LAB — SURGICAL PATHOLOGY

## 2024-06-01 ENCOUNTER — Telehealth: Payer: Self-pay | Admitting: Family Medicine

## 2024-06-01 NOTE — Telephone Encounter (Signed)
 Patient is wanting pap smear results explained to her she is not understanding them and would like clarification.

## 2024-06-05 ENCOUNTER — Encounter (HOSPITAL_BASED_OUTPATIENT_CLINIC_OR_DEPARTMENT_OTHER): Payer: Self-pay | Admitting: Obstetrics & Gynecology
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
## 2024-06-12 ENCOUNTER — Encounter: Payer: Self-pay | Admitting: *Deleted

## 2024-07-05 ENCOUNTER — Telehealth: Payer: Self-pay | Admitting: Family Medicine

## 2024-07-05 NOTE — Telephone Encounter (Signed)
 Patient says she is still waiting to hear back from the Doctor in regards to her last appointment. She wants to know what her next steps should be.

## 2024-07-10 ENCOUNTER — Encounter: Payer: Self-pay | Admitting: Obstetrics and Gynecology

## 2024-07-11 ENCOUNTER — Ambulatory Visit (INDEPENDENT_AMBULATORY_CARE_PROVIDER_SITE_OTHER)

## 2024-07-11 ENCOUNTER — Other Ambulatory Visit: Payer: Self-pay

## 2024-07-11 ENCOUNTER — Other Ambulatory Visit (HOSPITAL_COMMUNITY)
Admission: RE | Admit: 2024-07-11 | Discharge: 2024-07-11 | Disposition: A | Discharge status: Home / Self Care | Source: Ambulatory Visit | Attending: Family Medicine | Admitting: Family Medicine

## 2024-07-11 VITALS — BP 123/64 | HR 75 | Wt 195.6 lb

## 2024-07-11 DIAGNOSIS — Z113 Encounter for screening for infections with a predominantly sexual mode of transmission: Secondary | ICD-10-CM

## 2024-07-11 NOTE — Progress Notes (Signed)
 Here today for STD Screening. Would like test of cure for BV, recently completed treatment and is no longer having symptoms. Explained if she is without symptoms we do not recommend testing for yeast or BV. Patient okay with STD screening only. Self swab completed. Patient will be contacted with any abnormal results.   Vernell RN 07/11/24

## 2024-07-12 ENCOUNTER — Ambulatory Visit: Payer: Self-pay | Admitting: Family Medicine

## 2024-07-12 LAB — CERVICOVAGINAL ANCILLARY ONLY
Chlamydia: NEGATIVE
Comment: NEGATIVE
Comment: NEGATIVE
Comment: NORMAL
Neisseria Gonorrhea: NEGATIVE
Trichomonas: NEGATIVE

## 2024-09-18 ENCOUNTER — Ambulatory Visit: Admitting: Obstetrics and Gynecology

## 2024-09-20 ENCOUNTER — Other Ambulatory Visit: Payer: Self-pay

## 2024-09-20 ENCOUNTER — Ambulatory Visit (INDEPENDENT_AMBULATORY_CARE_PROVIDER_SITE_OTHER): Payer: Self-pay

## 2024-09-20 ENCOUNTER — Other Ambulatory Visit (HOSPITAL_COMMUNITY)
Admission: RE | Admit: 2024-09-20 | Discharge: 2024-09-20 | Disposition: A | Discharge status: Home / Self Care | Source: Ambulatory Visit | Attending: Family Medicine | Admitting: Family Medicine

## 2024-09-20 VITALS — BP 106/70 | HR 72 | Wt 198.8 lb

## 2024-09-20 DIAGNOSIS — N898 Other specified noninflammatory disorders of vagina: Secondary | ICD-10-CM

## 2024-09-20 DIAGNOSIS — Z113 Encounter for screening for infections with a predominantly sexual mode of transmission: Secondary | ICD-10-CM | POA: Diagnosis present

## 2024-09-20 NOTE — Progress Notes (Signed)
 Patient in clinic today for STD testing and vaginal self swab.  Requesting routine STD no known exposure.  She endorses intermittent mild vaginal odor, requested to test for BV and Yeast as well.  Advised patient that clinical staff will contact her with any abnormal results.  Reminded patient of LEEP procedure with Dr. Jeralyn 10/24/24.     Waddell, RN

## 2024-09-21 ENCOUNTER — Ambulatory Visit: Payer: Self-pay | Admitting: Obstetrics and Gynecology

## 2024-09-21 DIAGNOSIS — B9689 Other specified bacterial agents as the cause of diseases classified elsewhere: Secondary | ICD-10-CM

## 2024-09-21 DIAGNOSIS — N760 Acute vaginitis: Secondary | ICD-10-CM

## 2024-09-21 LAB — RPR+HBSAG+HCVAB+...
HIV Screen 4th Generation wRfx: NONREACTIVE
Hep C Virus Ab: NONREACTIVE
Hepatitis B Surface Ag: NEGATIVE
RPR Ser Ql: NONREACTIVE

## 2024-09-21 LAB — CERVICOVAGINAL ANCILLARY ONLY
Bacterial Vaginitis (gardnerella): POSITIVE — AB
Candida Glabrata: NEGATIVE
Candida Vaginitis: NEGATIVE
Chlamydia: NEGATIVE
Comment: NEGATIVE
Comment: NEGATIVE
Comment: NEGATIVE
Comment: NEGATIVE
Comment: NEGATIVE
Comment: NORMAL
Neisseria Gonorrhea: NEGATIVE
Trichomonas: NEGATIVE

## 2024-09-21 MED ORDER — METRONIDAZOLE 500 MG PO TABS: 500.0000 mg | ORAL_TABLET | Freq: Two times a day (BID) | ORAL | 0 refills | Status: AC

## 2024-09-21 MED ORDER — FLUCONAZOLE 150 MG PO TABS: 150.0000 mg | ORAL_TABLET | Freq: Once | ORAL | 0 refills | Status: AC

## 2024-10-24 ENCOUNTER — Ambulatory Visit: Admitting: Obstetrics and Gynecology
